# Patient Record
Sex: Female | Born: 1970 | Race: Black or African American | Hispanic: No | Smoking: Never smoker
Health system: Southern US, Community
[De-identification: ages and names within clinical notes are randomized; demographics above are authoritative.]

## PROBLEM LIST (undated history)

## (undated) DIAGNOSIS — O24419 Gestational diabetes mellitus in pregnancy, unspecified control: Secondary | ICD-10-CM

## (undated) DIAGNOSIS — IMO0001 Reserved for inherently not codable concepts without codable children: Secondary | ICD-10-CM

## (undated) DIAGNOSIS — A599 Trichomoniasis, unspecified: Secondary | ICD-10-CM

## (undated) DIAGNOSIS — R896 Abnormal cytological findings in specimens from other organs, systems and tissues: Secondary | ICD-10-CM

## (undated) DIAGNOSIS — B009 Herpesviral infection, unspecified: Secondary | ICD-10-CM

## (undated) DIAGNOSIS — Z8719 Personal history of other diseases of the digestive system: Secondary | ICD-10-CM

## (undated) DIAGNOSIS — Z862 Personal history of diseases of the blood and blood-forming organs and certain disorders involving the immune mechanism: Secondary | ICD-10-CM

## (undated) DIAGNOSIS — B379 Candidiasis, unspecified: Secondary | ICD-10-CM

## (undated) HISTORY — DX: Gestational diabetes mellitus in pregnancy, unspecified control: O24.419

## (undated) HISTORY — DX: Trichomoniasis, unspecified: A59.9

## (undated) HISTORY — DX: Candidiasis, unspecified: B37.9

## (undated) HISTORY — DX: Personal history of other diseases of the digestive system: Z87.19

## (undated) HISTORY — PX: COLPOSCOPY: SHX161

## (undated) HISTORY — DX: Abnormal cytological findings in specimens from other organs, systems and tissues: R89.6

## (undated) HISTORY — DX: Reserved for inherently not codable concepts without codable children: IMO0001

## (undated) HISTORY — DX: Herpesviral infection, unspecified: B00.9

## (undated) HISTORY — DX: Personal history of diseases of the blood and blood-forming organs and certain disorders involving the immune mechanism: Z86.2

## (undated) HISTORY — PX: WISDOM TOOTH EXTRACTION: SHX21

---
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
## 1998-08-30 HISTORY — PX: LEEP: SHX91

## 1999-03-31 ENCOUNTER — Other Ambulatory Visit: Admission: RE | Admit: 1999-03-31 | Discharge: 1999-03-31 | Payer: Self-pay | Admitting: Obstetrics & Gynecology

## 1999-05-28 ENCOUNTER — Other Ambulatory Visit: Admission: RE | Admit: 1999-05-28 | Discharge: 1999-05-28 | Payer: Self-pay | Admitting: Obstetrics & Gynecology

## 1999-05-29 ENCOUNTER — Other Ambulatory Visit: Admission: RE | Admit: 1999-05-29 | Discharge: 1999-05-29 | Payer: Self-pay | Admitting: Obstetrics & Gynecology

## 1999-07-06 ENCOUNTER — Ambulatory Visit (HOSPITAL_COMMUNITY): Admission: RE | Admit: 1999-07-06 | Discharge: 1999-07-06 | Payer: Self-pay | Admitting: Obstetrics & Gynecology

## 1999-10-13 ENCOUNTER — Other Ambulatory Visit: Admission: RE | Admit: 1999-10-13 | Discharge: 1999-10-13 | Payer: Self-pay | Admitting: Obstetrics & Gynecology

## 2000-02-22 ENCOUNTER — Other Ambulatory Visit: Admission: RE | Admit: 2000-02-22 | Discharge: 2000-02-22 | Payer: Self-pay | Admitting: Obstetrics & Gynecology

## 2000-06-23 ENCOUNTER — Other Ambulatory Visit: Admission: RE | Admit: 2000-06-23 | Discharge: 2000-06-23 | Payer: Self-pay | Admitting: *Deleted

## 2001-07-03 ENCOUNTER — Other Ambulatory Visit: Admission: RE | Admit: 2001-07-03 | Discharge: 2001-07-03 | Payer: Self-pay | Admitting: Obstetrics and Gynecology

## 2002-07-16 ENCOUNTER — Other Ambulatory Visit: Admission: RE | Admit: 2002-07-16 | Discharge: 2002-07-16 | Payer: Self-pay | Admitting: Obstetrics and Gynecology

## 2003-08-02 ENCOUNTER — Other Ambulatory Visit: Admission: RE | Admit: 2003-08-02 | Discharge: 2003-08-02 | Payer: Self-pay | Admitting: Obstetrics and Gynecology

## 2003-11-20 ENCOUNTER — Encounter: Admission: RE | Admit: 2003-11-20 | Discharge: 2004-02-18 | Payer: Self-pay | Admitting: Obstetrics and Gynecology

## 2003-12-24 ENCOUNTER — Ambulatory Visit (HOSPITAL_COMMUNITY): Admission: RE | Admit: 2003-12-24 | Discharge: 2003-12-24 | Payer: Self-pay | Admitting: Obstetrics and Gynecology

## 2004-02-26 ENCOUNTER — Ambulatory Visit (HOSPITAL_COMMUNITY): Admission: RE | Admit: 2004-02-26 | Discharge: 2004-02-26 | Payer: Self-pay | Admitting: Obstetrics and Gynecology

## 2004-03-24 ENCOUNTER — Ambulatory Visit (HOSPITAL_COMMUNITY): Admission: RE | Admit: 2004-03-24 | Discharge: 2004-03-24 | Payer: Self-pay | Admitting: Obstetrics and Gynecology

## 2004-04-16 ENCOUNTER — Inpatient Hospital Stay (HOSPITAL_COMMUNITY): Admission: AD | Admit: 2004-04-16 | Discharge: 2004-04-16 | Payer: Self-pay | Admitting: Obstetrics and Gynecology

## 2004-04-18 ENCOUNTER — Inpatient Hospital Stay (HOSPITAL_COMMUNITY): Admission: AD | Admit: 2004-04-18 | Discharge: 2004-04-18 | Payer: Self-pay | Admitting: Obstetrics and Gynecology

## 2004-05-20 ENCOUNTER — Inpatient Hospital Stay (HOSPITAL_COMMUNITY): Admission: AD | Admit: 2004-05-20 | Discharge: 2004-05-23 | Payer: Self-pay | Admitting: Obstetrics and Gynecology

## 2004-09-03 ENCOUNTER — Other Ambulatory Visit: Admission: RE | Admit: 2004-09-03 | Discharge: 2004-09-03 | Payer: Self-pay | Admitting: Obstetrics and Gynecology

## 2010-09-20 ENCOUNTER — Encounter: Payer: Self-pay | Admitting: Obstetrics and Gynecology

## 2012-03-06 ENCOUNTER — Encounter: Payer: Self-pay | Admitting: Obstetrics and Gynecology

## 2012-10-23 ENCOUNTER — Telehealth: Payer: Self-pay | Admitting: Obstetrics and Gynecology

## 2012-10-23 ENCOUNTER — Encounter: Payer: Self-pay | Admitting: Obstetrics and Gynecology

## 2012-10-23 ENCOUNTER — Ambulatory Visit: Payer: BC Managed Care – PPO | Admitting: Obstetrics and Gynecology

## 2012-10-23 VITALS — BP 92/60 | HR 64 | Ht 64.0 in | Wt 176.0 lb

## 2012-10-23 DIAGNOSIS — H02849 Edema of unspecified eye, unspecified eyelid: Secondary | ICD-10-CM

## 2012-10-23 DIAGNOSIS — Z8632 Personal history of gestational diabetes: Secondary | ICD-10-CM

## 2012-10-23 DIAGNOSIS — Z124 Encounter for screening for malignant neoplasm of cervix: Secondary | ICD-10-CM

## 2012-10-23 DIAGNOSIS — Z01419 Encounter for gynecological examination (general) (routine) without abnormal findings: Secondary | ICD-10-CM

## 2012-10-23 DIAGNOSIS — Z83438 Family history of other disorder of lipoprotein metabolism and other lipidemia: Secondary | ICD-10-CM

## 2012-10-23 DIAGNOSIS — Z8349 Family history of other endocrine, nutritional and metabolic diseases: Secondary | ICD-10-CM

## 2012-10-23 DIAGNOSIS — Z833 Family history of diabetes mellitus: Secondary | ICD-10-CM

## 2012-10-23 DIAGNOSIS — Z0000 Encounter for general adult medical examination without abnormal findings: Secondary | ICD-10-CM

## 2012-10-23 LAB — LIPID PANEL
Cholesterol: 169 mg/dL (ref 0–200)
HDL: 49 mg/dL (ref >39)
LDL Cholesterol: 99 mg/dL (ref 0–99)
Total CHOL/HDL Ratio: 3.4 Ratio
VLDL: 21 mg/dL (ref 0–40)

## 2012-10-23 LAB — COMPREHENSIVE METABOLIC PANEL
Albumin: 4.1 g/dL (ref 3.5–5.2)
CO2: 31 mEq/L (ref 19–32)
Calcium: 9.7 mg/dL (ref 8.4–10.5)
Creat: 0.87 mg/dL (ref 0.50–1.10)
Sodium: 137 mEq/L (ref 135–145)
Total Bilirubin: 0.4 mg/dL (ref 0.3–1.2)

## 2012-10-23 LAB — CBC
HCT: 30.1 % — ABNORMAL LOW (ref 36.0–46.0)
Hemoglobin: 9.5 g/dL — ABNORMAL LOW (ref 12.0–15.0)
MCH: 20.8 pg — ABNORMAL LOW (ref 26.0–34.0)
Platelets: 303 10*3/uL (ref 150–400)
RBC: 4.57 MIL/uL (ref 3.87–5.11)
RDW: 18.6 % — ABNORMAL HIGH (ref 11.5–15.5)
WBC: 6.4 10*3/uL (ref 4.0–10.5)

## 2012-10-23 NOTE — Progress Notes (Signed)
Subjective:    Aerielle Stoklosa is a 42 y.o. female, G2P0, who presents for an annual exam. The patient reports no problems but would like her "labs" done-doesn't have a PCP.  Family history of renal disease, diabetes and hyperlipidemia.  Per patient, her eye doctor  advised that she should get her thyroid checked as her eyes appear enlarged.  Last meal 2.5 hours ago.  Has a history of gestational diabetes.  Menstrual cycle:   LMP: Patient's last menstrual period was 10/13/2012.             Review of Systems Pertinent items are noted in HPI. Denies pelvic pain, urinary tract symptoms, vaginitis symptoms, irregular bleeding, menopausal symptoms, change in bowel habits or rectal bleeding   Objective:    BP 92/60  Pulse 64  Ht 5\' 4"  (1.626 m)  Wt 176 lb (79.833 kg)  BMI 30.2 kg/m2  LMP 10/13/2012    Wt Readings from Last 1 Encounters:  10/23/12 176 lb (79.833 kg)   Body mass index is 30.2 kg/(m^2). General Appearance: Alert, no acute distress HEENT: Grossly normal Neck / Thyroid: Supple, no thyromegaly or cervical adenopathy Lungs: Clear to auscultation bilaterally Back: No CVA tenderness Breast Exam: No masses or nodes.No dimpling, nipple retraction or discharge. Cardiovascular: Regular rate and rhythm.  Gastrointestinal: Soft, non-tender, no masses or organomegaly Pelvic Exam: EGBUS-wnl, vagina-normal rugae, cervix- without lesions or tenderness, uterus appears normal size shape and consistency, adnexae-no masses or tenderness Rectovaginal: no masses and normal sphincter tone Lymphatic Exam: Non-palpable nodes in neck, clavicular,  axillary, or inguinal regions  Skin: no rashes or abnormalities Extremities: no clubbing cyanosis or edema  Neurologic: grossly normal Psychiatric: Alert and oriented    Assessment:   Routine GYN Exam H/O Gestational Diabetes Family History Hyperlipidemia TSH Screening per Ophthalmologist Routine Adult Periodic Lab Screening   Plan:  TSH.  Lipid Panel, CBC, CMET -pending  PAP sent  RTO 1 year or prn  Shabrea Weldin,ELMIRAPA-C

## 2012-10-23 NOTE — Progress Notes (Signed)
Regular Periods: yes Mammogram: yes  Monthly Breast Ex.: yes Exercise: yes  Tetanus < 10 years: no Seatbelts: yes  NI. Bladder Functn.: yes Abuse at home: no  Daily BM's: yes Stressful Work: no  Healthy Diet: yes Sigmoid-Colonoscopy: on  Calcium: yes Medical problems this year: none   LAST PAP:2/13  Contraception: condoms  Mammogram:  2013  PCP: no  PMH: no change  FMH: no change  Last Bone Scan: no  Pt is single

## 2012-10-24 LAB — PAP IG W/ RFLX HPV ASCU

## 2012-10-24 MED ORDER — VALACYCLOVIR HCL 500 MG PO TABS: 500.0000 mg | ORAL_TABLET | Freq: Two times a day (BID) | ORAL | Status: AC

## 2012-10-24 NOTE — Telephone Encounter (Signed)
TC to pt. States forgot to ask for RF for VAltrex ar visit. States takes only for outbreaks. Per protocol, rx sent.

## 2012-10-25 LAB — HUMAN PAPILLOMAVIRUS, HIGH RISK: HPV DNA High Risk: NOT DETECTED

## 2012-10-26 ENCOUNTER — Telehealth: Payer: Self-pay

## 2012-10-26 ENCOUNTER — Encounter: Payer: Self-pay | Admitting: Obstetrics and Gynecology

## 2012-10-26 NOTE — Telephone Encounter (Signed)
Message copied by Darien Ramus on Thu Oct 26, 2012  1:56 PM ------      Message from: Winfred Leeds      Created: Thu Oct 26, 2012  1:54 PM                   ----- Message -----         From: Henreitta Leber, PA         Sent: 10/26/2012   1:47 PM           To: Winfred Leeds, CMA            Please advise patient of normal labs except that her iron is low (hemoglobin = 9.5).  Advise to take some over the counter iron supplements twice a day (with stool softeners if she is prone to constipation) for the next 8 weeks.  We can repeat her test then to see if her iron level is normal.  [a letter is being sent to patient about her PAP smear-repeat in 12 months.  Thanks,   EP ------

## 2012-10-26 NOTE — Telephone Encounter (Signed)
Pt aware and voiced understanding.  Zaydin Billey, CMA  

## 2012-10-26 NOTE — Telephone Encounter (Signed)
LVM for return call  Lucciana Head, CMA  

## 2014-07-01 ENCOUNTER — Encounter: Payer: Self-pay | Admitting: Obstetrics and Gynecology

## 2018-03-13 ENCOUNTER — Encounter (HOSPITAL_BASED_OUTPATIENT_CLINIC_OR_DEPARTMENT_OTHER): Payer: Self-pay | Admitting: *Deleted

## 2018-03-13 ENCOUNTER — Emergency Department (HOSPITAL_BASED_OUTPATIENT_CLINIC_OR_DEPARTMENT_OTHER)
Admission: EM | Admit: 2018-03-13 | Discharge: 2018-03-13 | Disposition: A | Discharge status: Home / Self Care | Payer: Managed Care, Other (non HMO) | Attending: Emergency Medicine | Admitting: Emergency Medicine

## 2018-03-13 ENCOUNTER — Emergency Department (HOSPITAL_BASED_OUTPATIENT_CLINIC_OR_DEPARTMENT_OTHER): Payer: Managed Care, Other (non HMO)

## 2018-03-13 ENCOUNTER — Other Ambulatory Visit: Payer: Self-pay

## 2018-03-13 DIAGNOSIS — Y92481 Parking lot as the place of occurrence of the external cause: Secondary | ICD-10-CM | POA: Diagnosis not present

## 2018-03-13 DIAGNOSIS — Y9389 Activity, other specified: Secondary | ICD-10-CM | POA: Insufficient documentation

## 2018-03-13 DIAGNOSIS — S39012A Strain of muscle, fascia and tendon of lower back, initial encounter: Secondary | ICD-10-CM | POA: Diagnosis not present

## 2018-03-13 DIAGNOSIS — Y999 Unspecified external cause status: Secondary | ICD-10-CM | POA: Insufficient documentation

## 2018-03-13 DIAGNOSIS — S3992XA Unspecified injury of lower back, initial encounter: Secondary | ICD-10-CM | POA: Diagnosis present

## 2018-03-13 DIAGNOSIS — Z79899 Other long term (current) drug therapy: Secondary | ICD-10-CM | POA: Diagnosis not present

## 2018-03-13 LAB — PREGNANCY, URINE: Preg Test, Ur: NEGATIVE

## 2018-03-13 MED ORDER — CYCLOBENZAPRINE HCL 10 MG PO TABS: 10.0000 mg | ORAL_TABLET | Freq: Two times a day (BID) | ORAL | 0 refills | Status: DC | PRN

## 2018-03-13 MED ORDER — KETOROLAC TROMETHAMINE 30 MG/ML IJ SOLN
30.0000 mg | Freq: Once | INTRAMUSCULAR | Status: AC
  Administered 2018-03-13: 30 mg via INTRAMUSCULAR
  Filled 2018-03-13: qty 1

## 2018-03-13 MED ORDER — ACETAMINOPHEN 500 MG PO TABS
1000.0000 mg | ORAL_TABLET | Freq: Once | ORAL | Status: AC
Start: 2018-03-13 — End: 2018-03-13
  Administered 2018-03-13: 1000 mg via ORAL
  Filled 2018-03-13: qty 2

## 2018-03-13 MED FILL — CYCLOBENZAPRINE HCL 10 MG T: 10 | 4 days supply | Qty: 8 | Fill #0
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
## 2019-06-28 ENCOUNTER — Encounter (HOSPITAL_BASED_OUTPATIENT_CLINIC_OR_DEPARTMENT_OTHER): Payer: Self-pay | Admitting: Emergency Medicine

## 2019-06-28 ENCOUNTER — Other Ambulatory Visit: Payer: Self-pay

## 2019-06-28 ENCOUNTER — Inpatient Hospital Stay (HOSPITAL_BASED_OUTPATIENT_CLINIC_OR_DEPARTMENT_OTHER)
Admission: EM | Admit: 2019-06-28 | Discharge: 2019-07-08 | DRG: 066 | Disposition: A | Discharge status: Home / Self Care | Payer: Managed Care, Other (non HMO) | Source: Ambulatory Visit | Attending: Neurosurgery | Admitting: Neurosurgery

## 2019-06-28 ENCOUNTER — Emergency Department (HOSPITAL_BASED_OUTPATIENT_CLINIC_OR_DEPARTMENT_OTHER): Payer: Managed Care, Other (non HMO)

## 2019-06-28 DIAGNOSIS — E611 Iron deficiency: Secondary | ICD-10-CM | POA: Diagnosis present

## 2019-06-28 DIAGNOSIS — B009 Herpesviral infection, unspecified: Secondary | ICD-10-CM | POA: Diagnosis present

## 2019-06-28 DIAGNOSIS — Z833 Family history of diabetes mellitus: Secondary | ICD-10-CM

## 2019-06-28 DIAGNOSIS — I609 Nontraumatic subarachnoid hemorrhage, unspecified: Principal | ICD-10-CM

## 2019-06-28 DIAGNOSIS — D573 Sickle-cell trait: Secondary | ICD-10-CM | POA: Diagnosis present

## 2019-06-28 DIAGNOSIS — Z888 Allergy status to other drugs, medicaments and biological substances status: Secondary | ICD-10-CM | POA: Diagnosis not present

## 2019-06-28 DIAGNOSIS — Z20828 Contact with and (suspected) exposure to other viral communicable diseases: Secondary | ICD-10-CM | POA: Diagnosis present

## 2019-06-28 DIAGNOSIS — Z8632 Personal history of gestational diabetes: Secondary | ICD-10-CM

## 2019-06-28 DIAGNOSIS — Z79899 Other long term (current) drug therapy: Secondary | ICD-10-CM

## 2019-06-28 DIAGNOSIS — Z91018 Allergy to other foods: Secondary | ICD-10-CM

## 2019-06-28 DIAGNOSIS — E1165 Type 2 diabetes mellitus with hyperglycemia: Secondary | ICD-10-CM

## 2019-06-28 LAB — CBC WITH DIFFERENTIAL/PLATELET
Abs Immature Granulocytes: 0.09 10*3/uL — ABNORMAL HIGH (ref 0.00–0.07)
Basophils Absolute: 0 10*3/uL (ref 0.0–0.1)
Basophils Relative: 0 %
Eosinophils Absolute: 0 10*3/uL (ref 0.0–0.5)
Eosinophils Relative: 0 %
HCT: 37.6 % (ref 36.0–46.0)
Hemoglobin: 12.2 g/dL (ref 12.0–15.0)
Immature Granulocytes: 1 %
Lymphocytes Relative: 2 %
Lymphs Abs: 0.4 10*3/uL — ABNORMAL LOW (ref 0.7–4.0)
MCH: 25.3 pg — ABNORMAL LOW (ref 26.0–34.0)
MCHC: 32.4 g/dL (ref 30.0–36.0)
MCV: 77.8 fL — ABNORMAL LOW (ref 80.0–100.0)
Monocytes Absolute: 0.4 10*3/uL (ref 0.1–1.0)
Monocytes Relative: 2 %
Neutro Abs: 16.4 10*3/uL — ABNORMAL HIGH (ref 1.7–7.7)
Neutrophils Relative %: 95 %
Platelets: 209 10*3/uL (ref 150–400)
RBC: 4.83 MIL/uL (ref 3.87–5.11)
RDW: 14.2 % (ref 11.5–15.5)
WBC: 17.3 10*3/uL — ABNORMAL HIGH (ref 4.0–10.5)
nRBC: 0 % (ref 0.0–0.2)

## 2019-06-28 LAB — BASIC METABOLIC PANEL
Anion gap: 12 (ref 5–15)
BUN: 9 mg/dL (ref 6–20)
CO2: 23 mmol/L (ref 22–32)
Calcium: 9.2 mg/dL (ref 8.9–10.3)
Chloride: 101 mmol/L (ref 98–111)
Creatinine, Ser: 0.82 mg/dL (ref 0.44–1.00)
GFR calc Af Amer: >60 mL/min (ref >60)
GFR calc non Af Amer: >60 mL/min (ref >60)
Glucose, Bld: 302 mg/dL — ABNORMAL HIGH (ref 70–99)
Potassium: 4.3 mmol/L (ref 3.5–5.1)
Sodium: 136 mmol/L (ref 135–145)

## 2019-06-28 LAB — PREGNANCY, URINE: Preg Test, Ur: NEGATIVE

## 2019-06-28 MED ORDER — SODIUM CHLORIDE 0.9 % IV BOLUS
500.0000 mL | Freq: Once | INTRAVENOUS | Status: AC
  Administered 2019-06-28: 500 mL via INTRAVENOUS

## 2019-06-28 MED ORDER — STROKE: EARLY STAGES OF RECOVERY BOOK
Freq: Once | Status: AC
  Administered 2019-06-29: 03:00:00
  Filled 2019-06-28 (×2): qty 1

## 2019-06-28 MED ORDER — BUTALBITAL-APAP-CAFFEINE 50-325-40 MG PO TABS
1.0000 | ORAL_TABLET | ORAL | Status: DC | PRN
  Administered 2019-06-29: 2 via ORAL
  Filled 2019-06-28 (×2): qty 2

## 2019-06-28 MED ORDER — NIMODIPINE 6 MG/ML PO SOLN
60.0000 mg | ORAL | Status: DC
  Administered 2019-06-29 – 2019-07-02 (×2): 60 mg
  Filled 2019-06-28 (×5): qty 10

## 2019-06-28 MED ORDER — LEVETIRACETAM IN NACL 500 MG/100ML IV SOLN
500.0000 mg | Freq: Two times a day (BID) | INTRAVENOUS | Status: DC
  Administered 2019-06-29 – 2019-07-01 (×5): 500 mg via INTRAVENOUS
  Filled 2019-06-28 (×6): qty 100

## 2019-06-28 MED ORDER — OXYCODONE HCL 5 MG PO TABS: 5.0000 mg | ORAL_TABLET | ORAL | Status: DC | PRN

## 2019-06-28 MED ORDER — ONDANSETRON 4 MG PO TBDP: 4.0000 mg | ORAL_TABLET | Freq: Four times a day (QID) | ORAL | Status: DC | PRN

## 2019-06-28 MED ORDER — CALCIUM CARBONATE 1250 (500 CA) MG PO TABS
0.5000 | ORAL_TABLET | Freq: Two times a day (BID) | ORAL | Status: DC
  Administered 2019-06-29 – 2019-07-07 (×12): 250 mg via ORAL
  Administered 2019-07-07: 17:00:00 via ORAL
  Administered 2019-07-08: 250 mg via ORAL
  Filled 2019-06-28 (×21): qty 0.5

## 2019-06-28 MED ORDER — METOCLOPRAMIDE HCL 5 MG/ML IJ SOLN
10.0000 mg | Freq: Once | INTRAMUSCULAR | Status: AC
  Administered 2019-06-28: 10 mg via INTRAVENOUS
  Filled 2019-06-28: qty 2

## 2019-06-28 MED ORDER — METHOCARBAMOL 1000 MG/10ML IJ SOLN
500.0000 mg | Freq: Once | INTRAMUSCULAR | Status: AC
Start: 2019-06-28 — End: 2019-06-28
  Administered 2019-06-28: 21:00:00 500 mg via INTRAMUSCULAR
  Filled 2019-06-28: qty 10

## 2019-06-28 MED ORDER — PRENATAL MULTIVITAMIN CH
1.0000 | ORAL_TABLET | Freq: Every day | ORAL | Status: DC
  Administered 2019-06-30 – 2019-07-08 (×8): 1 via ORAL
  Filled 2019-06-28 (×10): qty 1

## 2019-06-28 MED ORDER — DOCUSATE SODIUM 100 MG PO CAPS
100.0000 mg | ORAL_CAPSULE | Freq: Two times a day (BID) | ORAL | Status: DC
  Administered 2019-06-29 – 2019-07-07 (×12): 100 mg via ORAL
  Filled 2019-06-28 (×17): qty 1

## 2019-06-28 MED ORDER — KETOROLAC TROMETHAMINE 30 MG/ML IJ SOLN
30.0000 mg | Freq: Once | INTRAMUSCULAR | Status: AC
  Administered 2019-06-28: 21:00:00 30 mg via INTRAVENOUS
  Filled 2019-06-28: qty 1

## 2019-06-28 MED ORDER — CYCLOBENZAPRINE HCL 10 MG PO TABS
10.0000 mg | ORAL_TABLET | Freq: Two times a day (BID) | ORAL | Status: DC | PRN
  Administered 2019-07-04 – 2019-07-08 (×3): 10 mg via ORAL
  Filled 2019-06-28 (×4): qty 1

## 2019-06-28 MED ORDER — ACETAMINOPHEN 160 MG/5ML PO SOLN: 650.0000 mg | ORAL | Status: DC | PRN

## 2019-06-28 MED ORDER — ACETAMINOPHEN 500 MG PO TABS
1000.0000 mg | ORAL_TABLET | Freq: Once | ORAL | Status: AC
  Administered 2019-06-28: 1000 mg via ORAL
  Filled 2019-06-28: qty 2

## 2019-06-28 MED ORDER — NIMODIPINE 30 MG PO CAPS
60.0000 mg | ORAL_CAPSULE | ORAL | Status: DC
  Administered 2019-06-29 – 2019-07-08 (×53): 60 mg via ORAL
  Filled 2019-06-28 (×53): qty 2

## 2019-06-28 MED ORDER — CLEVIDIPINE BUTYRATE 0.5 MG/ML IV EMUL
0.0000 mg/h | INTRAVENOUS | Status: DC
  Administered 2019-06-29: 7 mg/h via INTRAVENOUS
  Administered 2019-06-29: 6 mg/h via INTRAVENOUS
  Administered 2019-06-29: 1 mg/h via INTRAVENOUS
  Administered 2019-06-29: 17 mg/h via INTRAVENOUS
  Administered 2019-06-29: 18 mg/h via INTRAVENOUS
  Administered 2019-06-29: 16 mg/h via INTRAVENOUS
  Filled 2019-06-28: qty 100
  Filled 2019-06-28 (×7): qty 50

## 2019-06-28 MED ORDER — IOHEXOL 350 MG/ML SOLN
100.0000 mL | Freq: Once | INTRAVENOUS | Status: AC | PRN
  Administered 2019-06-28: 100 mL via INTRAVENOUS

## 2019-06-28 MED ORDER — ACETAMINOPHEN-CODEINE #3 300-30 MG PO TABS
1.0000 | ORAL_TABLET | ORAL | Status: DC | PRN
  Administered 2019-06-30: 2 via ORAL
  Administered 2019-06-30: 1 via ORAL
  Administered 2019-07-01 – 2019-07-02 (×4): 2 via ORAL
  Administered 2019-07-03: 1 via ORAL
  Administered 2019-07-03 (×2): 2 via ORAL
  Administered 2019-07-03: 1 via ORAL
  Administered 2019-07-04 – 2019-07-06 (×5): 2 via ORAL
  Filled 2019-06-28 (×2): qty 2
  Filled 2019-06-28 (×2): qty 1
  Filled 2019-06-28 (×5): qty 2
  Filled 2019-06-28: qty 1
  Filled 2019-06-28 (×5): qty 2

## 2019-06-28 MED ORDER — ONDANSETRON HCL 4 MG/2ML IJ SOLN: 4.0000 mg | Freq: Four times a day (QID) | INTRAMUSCULAR | Status: DC | PRN

## 2019-06-28 MED ORDER — SODIUM CHLORIDE 0.9 % IV SOLN
INTRAVENOUS | Status: DC
  Administered 2019-06-29: 03:00:00 via INTRAVENOUS

## 2019-06-28 MED ORDER — ACETAMINOPHEN 325 MG PO TABS
650.0000 mg | ORAL_TABLET | ORAL | Status: DC | PRN
  Administered 2019-07-07 – 2019-07-08 (×2): 650 mg via ORAL
  Filled 2019-06-28 (×3): qty 2

## 2019-06-28 MED ORDER — ACETAMINOPHEN 650 MG RE SUPP: 650.0000 mg | RECTAL | Status: DC | PRN
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
## 2019-06-28 NOTE — ED Provider Notes (Signed)
[facility] EMERGENCY DEPARTMENT Provider Note   CSN: [id number] Arrival date & time: 06/28/19  1650     History   Chief Complaint Chief Complaint  Patient presents with  . Headache    HPI Carly Hicks is a 48 y.o. female here for evaluation of headache. Sudden onset while she was on a "stressful" call with work at 3 pm.  Described as severe 10/10, quick in onset over 5-30 seconds.  Pain began at base of scalp and radiating to back of head, down neck and in between shoulder blades.  She had associated breaking out in sweat, nausea, pounding in her ears and feeling like the sounds were fading out, neck pain, photophobia.  She arrived to ER and vomited once.  She took 1 advil at 3 pm without relief.  Currently pain 9.5/10.  Her headache and neck pain are worse with bright lights, head and neck movements.  Gets rare occasional headaches but never this bad. No history of migraines. No recent head or neck trauma, falls. No personal or family history of aneurysms, AVMs.  No blood thinners. No fever, chills, diplopia, difficulty swallowing, speech difficulty, tinnitus, one sided weakness or numbness. Went to UC at 4 pm but she couldn't get out or walk out of her car due to pain. She was told because of severity of pain and high BP she should come to ER. Reports BP is usually low, no h/o HTN. Was told at [facility] her SBP was 190s.   HPI  Past Medical History:  Diagnosis Date  . ASCUS (atypical squamous cells of undetermined significance) on Pap smear   . Gestational diabetes   . H/O sickle cell trait   . History of anal fissures   . HSV-2 infection   . Trichimoniasis 1991  . Yeast infection    h/o    Patient Active Problem List   Diagnosis Date Noted  . Subarachnoid hemorrhage ([place]) 06/28/2019    Past Surgical History:  Procedure Laterality Date  . COLPOSCOPY    . LEEP  2000  . WISDOM TOOTH EXTRACTION       OB History    Gravida  2   Para      Term      Preterm    AB      Living  1     SAB      TAB      Ectopic      Multiple      Live Births               Home Medications    Prior to Admission medications   Medication Sig Start Date End Date Taking? Authorizing Provider  calcium carbonate 200 MG capsule Take 250 mg by mouth 2 (two) times daily with a meal. Pt is taking chewable calcium    [provider]  cyclobenzaprine (FLEXERIL) 10 MG tablet Take 1 tablet (10 mg total) by mouth 2 (two) times daily as needed for muscle spasms. 03/13/18   Little, Wenda Overland, MD  IRON PO Take by mouth.    [provider]  Prenatal MV-Min-Fe Fum-FA-DHA (PRENATAL 1 PO) Take by mouth.    [provider]  valACYclovir (VALTREX) 500 MG tablet Take 1 tablet (500 mg total) by mouth 2 (two) times daily. TAKE ONE PO TWO TIMES A DAY X 3 DAYS AS NEEDED FOR OUTBREAK. 10/24/12   Earnstine Regal, PA-C    Family History Family History  Problem Relation Age of  Onset  . Diabetes Mother   . Hyperlipidemia Mother   . Thyroid disease Mother        mom had parathyroid removed  . Diabetes Maternal Aunt   . Kidney disease Maternal Aunt   . Diabetes Maternal Uncle   . Diabetes Maternal Grandmother   . Kidney disease Maternal Grandmother   . Anemia Sister   . Gestational diabetes Sister   . Colon cancer Paternal Aunt   . Chronic Renal Failure Maternal Aunt   . Cancer Maternal Aunt        breast    Social History Social History   Tobacco Use  . Smoking status: Never Smoker  . Smokeless tobacco: Never Used  Substance Use Topics  . Alcohol use: No  . Drug use: No     Allergies   Patient has no known allergies.   Review of Systems Review of Systems  Gastrointestinal: Positive for nausea and vomiting.  Musculoskeletal: Positive for neck pain and neck stiffness.  Neurological: Positive for headaches.  All other systems reviewed and are negative.    Physical Exam Updated Vital Signs BP (!) 147/87   Pulse 92   Temp  98.3 F (36.8 C) (Oral)   Resp 14   Ht 5\' 4"  (1.626 m)   Wt 74.8 kg   LMP 06/24/2019   SpO2 99%   BMI 28.32 kg/m   Physical Exam Vitals signs and nursing note reviewed.  Constitutional:      General: She is not in acute distress.    Appearance: She is well-developed.     Comments: NAD, non toxic but appears uncomfortable   HENT:     Head: Normocephalic and atraumatic.     Comments: No temporal tenderness    Right Ear: External ear normal.     Left Ear: External ear normal.     Nose: Nose normal.  Eyes:     General: No scleral icterus.    Conjunctiva/sclera: Conjunctivae normal.  Neck:     Musculoskeletal: Normal range of motion. Neck rigidity present.     Comments: Bilateral trapezius, paraspinal neck muscle tenderness. Decreased neck bend/rotation/flexion secondary to pain. +meningismus. No carotid bruits  Cardiovascular:     Rate and Rhythm: Normal rate and regular rhythm.     Heart sounds: Normal heart sounds.  Pulmonary:     Effort: Pulmonary effort is normal.     Breath sounds: Normal breath sounds.  Musculoskeletal: Normal range of motion.        General: No deformity.  Skin:    General: Skin is warm and dry.     Capillary Refill: Capillary refill takes less than 2 seconds.  Neurological:     Mental Status: She is alert and oriented to person, place, and time.     Comments:   Mental Status: Patient is awake, alert, oriented to person, place, year, and situation. Patient is able to give a clear and coherent history.  Speech is fluent and clear without dysarthria or aphasia.  No signs of neglect.  Cranial Nerves: I not tested II visual fields full bilaterally. PERRL.  Unable to visualize posterior eye. III, IV, VI EOMs intact without ptosis or diplopia  V sensation to light touch intact in all 3 divisions of trigeminal nerve bilaterally  VII facial movements symmetric bilaterally VIII hearing intact to voice/conversation  IX, X no uvula deviation, symmetric  rise of soft palate/uvula XI 5/5 SCM and trapezius strength bilaterally  XII tongue protrusion midline, symmetric L/R movements Motor:  Strength 5/5 in upper/lower extremities .   Sensation to light touch intact in face, upper/lower extremities. No pronator drift. No leg drop.  Cerebellar: No ataxia with finger to nose.  Steady gait. Normal Romberg.   Psychiatric:        Behavior: Behavior normal.        Thought Content: Thought content normal.        Judgment: Judgment normal.      ED Treatments / Results  Labs (all labs ordered are listed, but only abnormal results are displayed) Labs Reviewed  BASIC METABOLIC PANEL - Abnormal; Notable for the following components:      Result Value   Glucose, Bld 302 (*)    All other components within normal limits  CBC WITH DIFFERENTIAL/PLATELET - Abnormal; Notable for the following components:   WBC 17.3 (*)    MCV 77.8 (*)    MCH 25.3 (*)    Neutro Abs 16.4 (*)    Lymphs Abs 0.4 (*)    Abs Immature Granulocytes 0.09 (*)    All other components within normal limits  CBC - Abnormal; Notable for the following components:   WBC 15.5 (*)    MCV 77.8 (*)    MCH 25.4 (*)    All other components within normal limits  APTT - Abnormal; Notable for the following components:   aPTT 22 (*)    All other components within normal limits  SARS CORONAVIRUS 2 BY RT PCR (HOSPITAL ORDER, PERFORMED IN [facility])  SURGICAL PCR SCREEN  PREGNANCY, URINE  PROTIME-INR  HIV ANTIBODY (ROUTINE TESTING W REFLEX)  RAPID URINE DRUG SCREEN, HOSP PERFORMED    EKG None  Radiology Ct Angio Head W Or Wo Contrast  Result Date: 06/28/2019 CLINICAL DATA:  Thunderclap headache EXAM: CT ANGIOGRAPHY HEAD AND NECK TECHNIQUE: Multidetector CT imaging of the head and neck was performed using the standard protocol during bolus administration of intravenous contrast. Multiplanar CT image reconstructions and MIPs were obtained to evaluate the vascular  anatomy. Carotid stenosis measurements (when applicable) are obtained utilizing NASCET criteria, using the distal internal carotid diameter as the denominator. CONTRAST:  100mL OMNIPAQUE IOHEXOL 350 MG/ML SOLN COMPARISON:  None. FINDINGS: CT HEAD FINDINGS Brain: There is a large amount of subarachnoid hemorrhage within the basal cisterns and sylvian fissures. The lateral ventricles are dilated. No evidence of transependymal interstitial edema. There is no midline shift. Skull: The visualized skull base, calvarium and extracranial soft tissues are normal. Sinuses/Orbits: No fluid levels or advanced mucosal thickening of the visualized paranasal sinuses. No mastoid or middle ear effusion. The orbits are normal. CTA NECK FINDINGS SKELETON: There is no bony spinal canal stenosis. No lytic or blastic lesion. OTHER NECK: Normal pharynx, larynx and major salivary glands. No cervical lymphadenopathy. Unremarkable thyroid gland. UPPER CHEST: No pneumothorax or pleural effusion. No nodules or masses. AORTIC ARCH: There is no calcific atherosclerosis of the aortic arch. There is no aneurysm, dissection or hemodynamically significant stenosis of the visualized portion of the aorta. Conventional 3 vessel aortic branching pattern. The visualized proximal subclavian arteries are widely patent. RIGHT CAROTID SYSTEM: Normal without aneurysm, dissection or stenosis. LEFT CAROTID SYSTEM: Normal without aneurysm, dissection or stenosis. VERTEBRAL ARTERIES: Left dominant configuration. Both origins are clearly patent. There is no dissection, occlusion or flow-limiting stenosis to the skull base (V1-V3 segments). CTA HEAD FINDINGS POSTERIOR CIRCULATION: --Vertebral arteries: Normal V4 segments. --Posterior inferior cerebellar arteries (PICA): Patent origins from the vertebral arteries. --Anterior inferior cerebellar arteries (AICA): Patent  origins from the basilar artery. --Basilar artery: Normal. --Superior cerebellar arteries: Normal.  --Posterior cerebral arteries: Normal. There are bilateral posterior communicating arteries (p-comm) that partially supply the PCAs. ANTERIOR CIRCULATION: --Intracranial internal carotid arteries: Normal. --Anterior cerebral arteries (ACA): Normal. Both A1 segments are present. Patent anterior communicating artery (a-comm). --Middle cerebral arteries (MCA): Normal. There is no aneurysm visualized. There appears to a concentration of subarachnoid blood adjacent to the cavernous segment of the right ICA, although this might also represent asymmetric cavernous sinus enhancement. VENOUS SINUSES: As permitted by contrast timing, patent. ANATOMIC VARIANTS: None Review of the MIP images confirms the above findings. IMPRESSION: 1. Large amount of subarachnoid hemorrhage within the basal cisterns and Sylvian fissures. Dilated ventricles without evidence of transependymal interstitial edema. 2. No aneurysm visualized. Concentration of subarachnoid blood along the medial aspect of the cavernous segment of the right ICA vs asymmetric cavernous sinus enhancement. 3. No arterial occlusion. Critical Value/emergent results were called by telephone at the time of interpretation on 06/28/2019 at 11:28 pm to providerCLAUDIA Kodah Maret , who verbally acknowledged these results. Electronically Signed   By: Deatra Robinson M.D.   On: 06/28/2019 23:39   Ct Angio Neck W And/or Wo Contrast  Result Date: 06/28/2019 CLINICAL DATA:  Thunderclap headache EXAM: CT ANGIOGRAPHY HEAD AND NECK TECHNIQUE: Multidetector CT imaging of the head and neck was performed using the standard protocol during bolus administration of intravenous contrast. Multiplanar CT image reconstructions and MIPs were obtained to evaluate the vascular anatomy. Carotid stenosis measurements (when applicable) are obtained utilizing NASCET criteria, using the distal internal carotid diameter as the denominator. CONTRAST:  OMNIPAQUE IOHEXOL 350 MG/ML SOLN COMPARISON:   None. FINDINGS: CT HEAD FINDINGS Brain: There is a large amount of subarachnoid hemorrhage within the basal cisterns and sylvian fissures. The lateral ventricles are dilated. No evidence of transependymal interstitial edema. There is no midline shift. Skull: The visualized skull base, calvarium and extracranial soft tissues are normal. Sinuses/Orbits: No fluid levels or advanced mucosal thickening of the visualized paranasal sinuses. No mastoid or middle ear effusion. The orbits are normal. CTA NECK FINDINGS SKELETON: There is no bony spinal canal stenosis. No lytic or blastic lesion. OTHER NECK: Normal pharynx, larynx and major salivary glands. No cervical lymphadenopathy. Unremarkable thyroid gland. UPPER CHEST: No pneumothorax or pleural effusion. No nodules or masses. AORTIC ARCH: There is no calcific atherosclerosis of the aortic arch. There is no aneurysm, dissection or hemodynamically significant stenosis of the visualized portion of the aorta. Conventional 3 vessel aortic branching pattern. The visualized proximal subclavian arteries are widely patent. RIGHT CAROTID SYSTEM: Normal without aneurysm, dissection or stenosis. LEFT CAROTID SYSTEM: Normal without aneurysm, dissection or stenosis. VERTEBRAL ARTERIES: Left dominant configuration. Both origins are clearly patent. There is no dissection, occlusion or flow-limiting stenosis to the skull base (V1-V3 segments). CTA HEAD FINDINGS POSTERIOR CIRCULATION: --Vertebral arteries: Normal V4 segments. --Posterior inferior cerebellar arteries (PICA): Patent origins from the vertebral arteries. --Anterior inferior cerebellar arteries (AICA): Patent origins from the basilar artery. --Basilar artery: Normal. --Superior cerebellar arteries: Normal. --Posterior cerebral arteries: Normal. There are bilateral posterior communicating arteries (p-comm) that partially supply the PCAs. ANTERIOR CIRCULATION: --Intracranial internal carotid arteries: Normal. --Anterior  cerebral arteries (ACA): Normal. Both A1 segments are present. Patent anterior communicating artery (a-comm). --Middle cerebral arteries (MCA): Normal. There is no aneurysm visualized. There appears to a concentration of subarachnoid blood adjacent to the cavernous segment of the right ICA, although this might also represent asymmetric cavernous sinus enhancement. VENOUS SINUSES: As  permitted by contrast timing, patent. ANATOMIC VARIANTS: None Review of the MIP images confirms the above findings. IMPRESSION: 1. Large amount of subarachnoid hemorrhage within the basal cisterns and Sylvian fissures. Dilated ventricles without evidence of transependymal interstitial edema. 2. No aneurysm visualized. Concentration of subarachnoid blood along the medial aspect of the cavernous segment of the right ICA vs asymmetric cavernous sinus enhancement. 3. No arterial occlusion. Critical Value/emergent results were called by telephone at the time of interpretation on 06/28/2019 at 11:28 pm to providerCLAUDIA Tyyne Cliett , who verbally acknowledged these results. Electronically Signed   By: Deatra Robinson M.D.   On: 06/28/2019 23:39    Procedures .Critical Care Performed by: Liberty Handy, PA-C Authorized by: Liberty Handy, PA-C   Critical care provider statement:    Critical care time (minutes):  45   Critical care was necessary to treat or prevent imminent or life-threatening deterioration of the following conditions:  CNS failure or compromise   Critical care was time spent personally by me on the following activities:  Discussions with consultants, evaluation of patient's response to treatment, examination of patient, ordering and performing treatments and interventions, ordering and review of laboratory studies, ordering and review of radiographic studies, pulse oximetry, re-evaluation of patient's condition, obtaining history from patient or surrogate, review of old charts and development of treatment plan  with patient or surrogate   I assumed direction of critical care for this patient from another provider in my specialty: no     (including critical care time)  Medications Ordered in ED Medications  butalbital-acetaminophen-caffeine (FIORICET) 50-325-40 MG per tablet 1-2 tablet (has no administration in time range)  oxyCODONE (Oxy IR/ROXICODONE) immediate release tablet 5 mg (has no administration in time range)  calcium carbonate (OS-CAL - dosed in mg of elemental calcium) tablet 250 mg of elemental calcium (has no administration in time range)  prenatal multivitamin tablet 1 tablet (has no administration in time range)  cyclobenzaprine (FLEXERIL) tablet 10 mg (has no administration in time range)   stroke: mapping our early stages of recovery book (has no administration in time range)  0.9 %  sodium chloride infusion (has no administration in time range)  acetaminophen (TYLENOL) tablet 650 mg (has no administration in time range)    Or  acetaminophen (TYLENOL) 160 MG/5ML solution 650 mg (has no administration in time range)    Or  acetaminophen (TYLENOL) suppository 650 mg (has no administration in time range)  docusate sodium (COLACE) capsule 100 mg (has no administration in time range)  ondansetron (ZOFRAN-ODT) disintegrating tablet 4 mg (has no administration in time range)    Or  ondansetron (ZOFRAN) injection 4 mg (has no administration in time range)  niMODipine (NIMOTOP) capsule 60 mg (has no administration in time range)    Or  niMODipine (NYMALIZE) 6 MG/ML oral solution 60 mg (has no administration in time range)  acetaminophen-codeine (TYLENOL #3) 300-30 MG per tablet 1-2 tablet (has no administration in time range)  clevidipine (CLEVIPREX) infusion 0.5 mg/mL (has no administration in time range)  levETIRAcetam (KEPPRA) IVPB 500 mg/100 mL premix (0 mg Intravenous Stopped 06/29/19 0128)  Chlorhexidine Gluconate Cloth 2 % PADS 6 each (has no administration in time range)   ketorolac (TORADOL) 30 MG/ML injection 30 mg (30 mg Intravenous Given 06/28/19 2122)  metoCLOPramide (REGLAN) injection 10 mg (10 mg Intravenous Given 06/28/19 2122)  acetaminophen (TYLENOL) tablet 1,000 mg (1,000 mg Oral Given 06/28/19 2122)  sodium chloride 0.9 % bolus 500 mL (0 mLs Intravenous Stopped  06/28/19 2221)  methocarbamol (ROBAXIN) injection 500 mg (500 mg Intramuscular Given 06/28/19 2122)  iohexol (OMNIPAQUE) 350 MG/ML injection 100 mL (100 mLs Intravenous Contrast Given 06/28/19 2301)     Initial Impression / Assessment and Plan / ED Course  I have reviewed the triage vital signs and the nursing notes.  Pertinent labs & imaging results that were available during my care of the patient were reviewed by me and considered in my medical decision making (see chart for details).  Ddx includes tension type headache vs migraine.  She describes several red flag features include sudden onset, severe, nausea, vomiting, sweats, neck pain and stiffness. 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  I am concerned about spontaneous atraumatic intracranial bleed, vascular injury in neck.  No fever and meningitis less likely.  No temporal tenderness. Atraumatic.   Recommended CT scan and migraine cocktail.  Patient decline CT and wanted to try medicine first. Thinks it is a "stress headache".   2145: Patient given migraine medicines, re-evaluated and reports no improvement in pain. Now agreeable with proceeding with imaging. CTA head and neck ordered.   CTA reviewed and interpreted by me as well as radiologist, +large bleed with likely aneurysm as culprit.    Pt updated, re-evaluated.  Reports moderate improvement in head and neck pain.  Repeat full neuro exam performed and no decline prior to transfer.   Spoke to NSGY PA [facility] who has accepted patient, see recommendations above. Anticipate procedure tomorrow AM, none tonight.  Admitting order per NSGY including cleviprex.  Admit to ICU.  Discussed with EDP.    Final Clinical Impressions(s) / ED Diagnoses   Final diagnoses:  SAH (subarachnoid hemorrhage) [facility])    ED Discharge Orders    None       Liberty Handy, PA-C 06/29/19 4540    Tegeler, Canary Brim, MD 06/29/19 1045

## 2019-06-28 NOTE — ED Triage Notes (Addendum)
Headache to the back of her head with neck pain that started 2 hours ago during a stressful phone call. Pt took "1 motrin" PTA

## 2019-06-29 ENCOUNTER — Encounter (HOSPITAL_COMMUNITY): Payer: Self-pay | Admitting: Critical Care Medicine

## 2019-06-29 ENCOUNTER — Inpatient Hospital Stay (HOSPITAL_COMMUNITY): Payer: Managed Care, Other (non HMO) | Admitting: Critical Care Medicine

## 2019-06-29 ENCOUNTER — Encounter (HOSPITAL_COMMUNITY)
Admission: EM | Disposition: A | Discharge status: Home / Self Care | Payer: Self-pay | Source: Ambulatory Visit | Attending: Neurosurgery

## 2019-06-29 ENCOUNTER — Inpatient Hospital Stay (HOSPITAL_COMMUNITY): Payer: Managed Care, Other (non HMO)

## 2019-06-29 DIAGNOSIS — I609 Nontraumatic subarachnoid hemorrhage, unspecified: Secondary | ICD-10-CM

## 2019-06-29 HISTORY — PX: IR ANGIO VERTEBRAL SEL VERTEBRAL BILAT MOD SED: IMG5369

## 2019-06-29 HISTORY — PX: RADIOLOGY WITH ANESTHESIA: SHX6223

## 2019-06-29 HISTORY — PX: IR ANGIO INTRA EXTRACRAN SEL INTERNAL CAROTID BILAT MOD SED: IMG5363

## 2019-06-29 LAB — RAPID URINE DRUG SCREEN, HOSP PERFORMED
Amphetamines: NOT DETECTED
Barbiturates: NOT DETECTED
Benzodiazepines: NOT DETECTED
Cocaine: NOT DETECTED
Opiates: NOT DETECTED
Tetrahydrocannabinol: NOT DETECTED

## 2019-06-29 LAB — CBC
HCT: 37.1 % (ref 36.0–46.0)
Hemoglobin: 12.1 g/dL (ref 12.0–15.0)
MCH: 25.4 pg — ABNORMAL LOW (ref 26.0–34.0)
MCHC: 32.6 g/dL (ref 30.0–36.0)
MCV: 77.8 fL — ABNORMAL LOW (ref 80.0–100.0)
Platelets: 214 10*3/uL (ref 150–400)
RBC: 4.77 MIL/uL (ref 3.87–5.11)
RDW: 14.2 % (ref 11.5–15.5)
WBC: 15.5 10*3/uL — ABNORMAL HIGH (ref 4.0–10.5)
nRBC: 0 % (ref 0.0–0.2)

## 2019-06-29 LAB — APTT: aPTT: 22 seconds — ABNORMAL LOW (ref 24–36)

## 2019-06-29 LAB — GLUCOSE, CAPILLARY
Glucose-Capillary: 199 mg/dL — ABNORMAL HIGH (ref 70–99)
Glucose-Capillary: 186 mg/dL — ABNORMAL HIGH (ref 70–99)
Glucose-Capillary: 167 mg/dL — ABNORMAL HIGH (ref 70–99)
Glucose-Capillary: 275 mg/dL — ABNORMAL HIGH (ref 70–99)

## 2019-06-29 LAB — SURGICAL PCR SCREEN
MRSA, PCR: NEGATIVE
Staphylococcus aureus: NEGATIVE

## 2019-06-29 LAB — PREGNANCY, URINE: Preg Test, Ur: NEGATIVE

## 2019-06-29 LAB — HEMOGLOBIN A1C
Hgb A1c MFr Bld: 9.9 % — ABNORMAL HIGH (ref 4.8–5.6)
Mean Plasma Glucose: 237.43 mg/dL

## 2019-06-29 LAB — PROTIME-INR
INR: 1 (ref 0.8–1.2)
Prothrombin Time: 13.4 seconds (ref 11.4–15.2)

## 2019-06-29 LAB — SARS CORONAVIRUS 2 BY RT PCR (HOSPITAL ORDER, PERFORMED IN ~~LOC~~ HOSPITAL LAB): SARS Coronavirus 2: NEGATIVE

## 2019-06-29 LAB — HIV ANTIBODY (ROUTINE TESTING W REFLEX): HIV Screen 4th Generation wRfx: NONREACTIVE

## 2019-06-29 SURGERY — IR WITH ANESTHESIA
Anesthesia: Monitor Anesthesia Care

## 2019-06-29 MED ORDER — CHLORHEXIDINE GLUCONATE CLOTH 2 % EX PADS
6.0000 | MEDICATED_PAD | Freq: Every day | CUTANEOUS | Status: DC
  Administered 2019-06-29 – 2019-07-07 (×8): 6 via TOPICAL

## 2019-06-29 MED ORDER — INSULIN ASPART 100 UNIT/ML ~~LOC~~ SOLN
0.0000 [IU] | Freq: Three times a day (TID) | SUBCUTANEOUS | Status: DC
  Administered 2019-06-29 – 2019-06-30 (×3): 3 [IU] via SUBCUTANEOUS
  Administered 2019-06-30: 15 [IU] via SUBCUTANEOUS

## 2019-06-29 MED ORDER — IOHEXOL 300 MG/ML  SOLN
150.0000 mL | Freq: Once | INTRAMUSCULAR | Status: AC | PRN
  Administered 2019-06-29: 45 mL via INTRA_ARTERIAL

## 2019-06-29 MED ORDER — LIDOCAINE HCL 1 % IJ SOLN
INTRAMUSCULAR | Status: AC
  Filled 2019-06-29: qty 20

## 2019-06-29 MED ORDER — LABETALOL HCL 5 MG/ML IV SOLN: 5.0000 mg | INTRAVENOUS | Status: DC | PRN

## 2019-06-29 MED ORDER — INSULIN ASPART 100 UNIT/ML ~~LOC~~ SOLN
0.0000 [IU] | Freq: Every day | SUBCUTANEOUS | Status: DC
  Administered 2019-06-29 – 2019-06-30 (×2): 3 [IU] via SUBCUTANEOUS

## 2019-06-29 MED ORDER — FENTANYL CITRATE (PF) 100 MCG/2ML IJ SOLN
INTRAMUSCULAR | Status: AC
  Filled 2019-06-29: qty 2

## 2019-06-29 NOTE — Progress Notes (Signed)
Transcranial Doppler  Date POD PCO2 HCT BP  MCA ACA PCA OPHT SIPH VERT Basilar  10/30     Right  Left   51  61   -19  -34   27  13   16  20    41  41   -12  -12   23           Right  Left                                            Right  Left                                             Right  Left                                             Right  Left                                            Right  Left                                            Right  Left                                        MCA = Middle Cerebral Artery      OPHT = Opthalmic Artery     BASILAR = Basilar Artery   ACA = Anterior Cerebral Artery     SIPH = Carotid Siphon PCA = Posterior Cerebral Artery   VERT = Verterbral Artery                   Normal MCA = 62+\-12 ACA = 50+\-12 PCA = 42+\-23

## 2019-06-29 NOTE — Progress Notes (Signed)
SLP Cancellation Note  Patient Details Name: Carly Hicks MRN: [id number] DOB: 10-Sep-1970   Cancelled treatment:       Reason Eval/Treat Not Completed: Patient at procedure or test/unavailable. Will f/u for cognitive linguistic interventions when appropriate.    Jartavious Mckimmy, Katherene Ponto 06/29/2019, 11:28 AM

## 2019-06-29 NOTE — Evaluation (Signed)
Physical Therapy Evaluation Patient Details Name: Carly Hicks MRN: [id number] DOB: 19-Jul-1971 Today's Date: 06/29/2019   History of Present Illness  Carly Hicks is a 48 y.o. female admitted with severe headache, stat CTA and was found to have a large subarachnoid hemorrhage involving the basilar cisterns and sylvian fissures.  Admitted to ICU for close monitoring and felt to have ventriculomegaly without signs of hydrocephalus.  Planned for cerebral angiogram later today.  Clinical Impression  Patient presents with mobility slightly limited due to BP regulation and decreased activity tolerance and mild balance deficits with BP up to 157 with mobility.  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  She will benefit from skilled PT in the acute setting to address these deficits and plan for d/c home with family support and no follow up PT.      Follow Up Recommendations No PT follow up    Equipment Recommendations  None recommended by PT    Recommendations for Other Services       Precautions / Restrictions Precautions Precautions: Other (comment) Precaution Comments: SBP <140      Mobility  Bed Mobility Overal bed mobility: Modified Independent                Transfers Overall transfer level: Modified independent                  Ambulation/Gait Ambulation/Gait assistance: Supervision Gait Distance (Feet): 130 Feet Assistive device: None Gait Pattern/deviations: Step-through pattern;Decreased stride length     General Gait Details: slow pace, but steady  Stairs Stairs: Yes Stairs assistance: Min guard Stair Management: One rail Left;Alternating pattern;Forwards Number of Stairs: 3 General stair comments: self selected pattern, holding rail and assist for lines, cues for slow cautious technique  Wheelchair Mobility    Modified Rankin (Stroke Patients  Only) Modified Rankin (Stroke Patients Only) Pre-Morbid Rankin Score: No symptoms Modified Rankin: Moderately severe disability     Balance Overall balance assessment: Mild deficits observed, not formally tested                                           Pertinent Vitals/Pain Pain Assessment: 0-10 Pain Score: 2  Pain Location: back of head Pain Descriptors / Indicators: Aching Pain Intervention(s): Monitored during session;Repositioned    Home Living Family/patient expects to be discharged to:: Private residence Living Arrangements: Spouse/significant other;Children Available Help at Discharge: Family Type of Home: House Home Access: Stairs to enter Entrance Stairs-Rails: Right Entrance Stairs-Number of Steps: 3 Home Layout: Multi-level;Bed/bath upstairs Home Equipment: None      Prior Function Level of Independence: Independent         Comments: works from home on [employer]        Extremity/Trunk Assessment   Upper Extremity Assessment Upper Extremity Assessment: Overall WFL for tasks assessed    Lower Extremity Assessment Lower Extremity Assessment: Overall WFL for tasks assessed       Communication   Communication: No difficulties  Cognition Arousal/Alertness: Awake/alert Behavior During Therapy: WFL for tasks assessed/performed Overall Cognitive Status: Within Functional Limits for tasks assessed  General Comments General comments (skin integrity, edema, etc.): BP 157/86 after ambulation, RN made aware and titrating up her Cleviprex drip; c/o tension in upper traps applied heat packs end of session    Exercises     Assessment/Plan    PT Assessment Patient needs continued PT services  PT Problem List Decreased activity tolerance;Decreased mobility;Decreased balance       PT Treatment Interventions Stair training;DME instruction;Therapeutic  activities;Balance training;Functional mobility training;Therapeutic exercise    PT Goals (Current goals can be found in the Care Plan section)  Acute Rehab PT Goals Patient Stated Goal: to return to independent PT Goal Formulation: With patient Time For Goal Achievement: 07/12/19 Potential to Achieve Goals: Good    Frequency Min 4X/week   Barriers to discharge        Co-evaluation               AM-PAC PT "6 Clicks" Mobility  Outcome Measure Help needed turning from your back to your side while in a flat bed without using bedrails?: None Help needed moving from lying on your back to sitting on the side of a flat bed without using bedrails?: None Help needed moving to and from a bed to a chair (including a wheelchair)?: None Help needed standing up from a chair using your arms (e.g., wheelchair or bedside chair)?: None Help needed to walk in hospital room?: A Little Help needed climbing 3-5 steps with a railing? : A Little 6 Click Score: 22    End of Session Equipment Utilized During Treatment: Gait belt Activity Tolerance: Patient tolerated treatment well Patient left: in bed;with call bell/phone within reach   PT Visit Diagnosis: Other abnormalities of gait and mobility (R26.89)    Time: 9628-3662 PT Time Calculation (min) (ACUTE ONLY): 24 min   Charges:   PT Evaluation $PT Eval Low Complexity: 1 Low PT Treatments $Gait Training: 8-22 mins        Sheran Lawless, PT Acute Rehabilitation Services [phone] 06/29/2019   [name] 06/29/2019, 11:47 AM

## 2019-06-29 NOTE — Sedation Documentation (Signed)
Right femoral sheath removed. 5 Fr. Exoseal deployed 

## 2019-06-29 NOTE — Transfer of Care (Signed)
Immediate Anesthesia Transfer of Care Note  Patient: Carly Hicks  Procedure(s) Performed: IR WITH ANESTHESIA (N/A )  Patient Location: ICU  Anesthesia Type:MAC  Level of Consciousness: awake, oriented and patient cooperative  Airway & Oxygen Therapy: Patient Spontanous Breathing  Post-op Assessment: Report given to RN and Post -op Vital signs reviewed and stable  Post vital signs: Reviewed and stable  Last Vitals:  Vitals Value Taken Time  BP 137/73 06/29/19 1606  Temp    Pulse 105 06/29/19 1609  Resp 22 06/29/19 1609  SpO2 99 % 06/29/19 1609  Vitals shown include unvalidated device data.  Last Pain:  Vitals:   06/29/19 1200  TempSrc:   PainSc: 2       Patients Stated Pain Goal: 0 (77/41/28 7867)  Complications: No apparent anesthesia complications

## 2019-06-29 NOTE — Evaluation (Addendum)
Occupational Therapy Evaluation Patient Details Name: Carly Hicks MRN: [id number] DOB: 17-Apr-1971 Today's Date: 06/29/2019    History of Present Illness Carly Hicks is a 48 y.o. female admitted with severe headache, stat CTA and was found to have a large subarachnoid hemorrhage involving the basilar cisterns and sylvian fissures.  Admitted to ICU for close monitoring and felt to have ventriculomegaly without signs of hydrocephalus.  Planned for cerebral angiogram later today.   Clinical Impression   This 48 y/o female presents with the above. PTA pt reports very independent with ADL, iADL and functional mobility. Pt performing functional transfers during session with minguard- light minA. She currently requires minA with LB ADL, setup/supervision for seated UB ADL. RN present during session and assisting with monitoring BP throughout. SBP 154 post toileting ADL via BSC, 146 when retaken approx 15 min after. Suspect pt to progress well once BP controlled and able to participate in further activity. She will benefit from continued acute OT services, currently recommend followup HHOT services though pending progress may be able to return home with no further OT needs. Will continue to follow acutely.    Follow Up Recommendations  Home health OT;Supervision/Assistance - 24 hour(suspect will progress to no follow up)    Equipment Recommendations  None recommended by OT(to be further assessed)           Precautions / Restrictions Precautions Precautions: Other (comment) Precaution Comments: SBP <140 Restrictions Weight Bearing Restrictions: No      Mobility Bed Mobility Overal bed mobility: Modified Independent                Transfers Overall transfer level: Needs assistance Equipment used: None Transfers: Sit to/from Stand;Stand Pivot Transfers Sit to Stand: Min guard;Supervision Stand pivot transfers: Min assist;Min guard       General transfer comment: light  minA for initial transfer to [facility], minguard assist for transfer back to bed    Balance Overall balance assessment: Needs assistance Sitting-balance support: Feet supported Sitting balance-Leahy Scale: Good     Standing balance support: During functional activity;No upper extremity supported Standing balance-Leahy Scale: Fair                             ADL either performed or assessed with clinical judgement   ADL Overall ADL's : Needs assistance/impaired Eating/Feeding: NPO Eating/Feeding Details (indicate cue type and reason): for procedure today, otherwise independent  Grooming: Set up;Sitting;Wash/dry hands   Upper Body Bathing: Set up;Sitting   Lower Body Bathing: Min guard;Sit to/from stand   Upper Body Dressing : Set up;Sitting   Lower Body Dressing: Minimal assistance;Sit to/from stand Lower Body Dressing Details (indicate cue type and reason): assist to doff pants while seated on BSC today Toilet Transfer: Minimal assistance;Min guard;Stand-pivot;BSC Toilet Transfer Details (indicate cue type and reason): minA initially, minguard for transfer back to bed from [facility], use of BSC vs toilet in bathroom as pt still with fluctating BP and difficult to maintain <140 (RN aware and okay with up to [facility]) Toileting- Clothing Manipulation and Hygiene: Min guard;Sit to/from stand;Sitting/lateral lean Toileting - Clothing Manipulation Details (indicate cue type and reason): pt performing pericare and clothing management with setup/minguard assist     Functional mobility during ADLs: Min guard;Minimal assistance General ADL Comments: limited session due to wanting to ensure BP remained <140, SBP 154 after return to supine post toileting ADL, SBP 146 thereafter when retaken  Pertinent Vitals/Pain Pain Assessment: Faces Pain Score: 2  Faces Pain Scale: Hurts little more Pain Location: back of head, traps Pain Descriptors / Indicators:  Aching Pain Intervention(s): Limited activity within patient's tolerance;Monitored during session;Repositioned     Hand Dominance     Extremity/Trunk Assessment Upper Extremity Assessment Upper Extremity Assessment: Overall WFL for tasks assessed   Lower Extremity Assessment Lower Extremity Assessment: Defer to PT evaluation;Overall WFL for tasks assessed       Communication Communication Communication: No difficulties   Cognition Arousal/Alertness: Awake/alert Behavior During Therapy: WFL for tasks assessed/performed Overall Cognitive Status: Within Functional Limits for tasks assessed                                     General Comments  RN present during session as she was still working on titrating Cleviprex to decrease SBP, SBP 154 post toileting ADL, 146 when retaken approx 15 min later    Exercises     Shoulder Instructions      Home Living Family/patient expects to be discharged to:: Private residence Living Arrangements: Spouse/significant other;Children Available Help at Discharge: Family Type of Home: House Home Access: Stairs to enter Technical brewer of Steps: 3 Entrance Stairs-Rails: Right Home Layout: Multi-level;Bed/bath upstairs Alternate Level Stairs-Number of Steps: 15 Alternate Level Stairs-Rails: Left Bathroom Shower/Tub: Occupational psychologist: Standard     Home Equipment: None          Prior Functioning/Environment Level of Independence: Independent        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        OT Problem List: Decreased strength;Decreased activity tolerance;Impaired balance (sitting and/or standing);Pain      OT Treatment/Interventions: Self-care/ADL training;Therapeutic exercise;Energy conservation;DME and/or AE instruction;Therapeutic activities;Patient/family education;Balance training    OT Goals(Current goals can be found in the care plan section) Acute Rehab OT Goals Patient Stated  Goal: to return to independent OT Goal Formulation: With patient Time For Goal Achievement: 07/13/19 Potential to Achieve Goals: Good  OT Frequency: Min 2X/week   Barriers to D/C:            Co-evaluation              AM-PAC OT "6 Clicks" Daily Activity     Outcome Measure Help from another person eating meals?: None Help from another person taking care of personal grooming?: A Little Help from another person toileting, which includes using toliet, bedpan, or urinal?: A Little Help from another person bathing (including washing, rinsing, drying)?: A Little Help from another person to put on and taking off regular upper body clothing?: None Help from another person to put on and taking off regular lower body clothing?: A Little 6 Click Score: 20   End of Session Nurse Communication: Mobility status  Activity Tolerance: Patient tolerated treatment well Patient left: in bed;with call bell/phone within reach;with nursing/sitter in room  OT Visit Diagnosis: Other symptoms and signs involving the nervous system (R29.898);Unsteadiness on feet (R26.81)                Time: 1202-1218 OT Time Calculation (min): 16 min Charges:  OT General Charges $OT Visit: 1 Visit OT Evaluation $OT Eval Moderate Complexity: [place], OT [employer] Pager [phone] Office [place] 06/29/2019, 2:38 PM

## 2019-06-29 NOTE — Anesthesia Preprocedure Evaluation (Addendum)
Anesthesia Evaluation  Patient identified by MRN, date of birth, ID band Patient awake    Reviewed: Allergy & Precautions, NPO status , Patient's Chart, lab work & pertinent test results  Airway Mallampati: II  TM Distance: >3 FB Neck ROM: Full    Dental no notable dental hx.    Pulmonary    Pulmonary exam normal        Cardiovascular negative cardio ROS Normal cardiovascular exam     Neuro/Psych negative psych ROS   GI/Hepatic   Endo/Other  diabetes  Renal/GU      Musculoskeletal   Abdominal Normal abdominal exam  (+)   Peds  Hematology   Anesthesia Other Findings   Reproductive/Obstetrics                           Anesthesia Physical Anesthesia Plan  ASA: III  Anesthesia Plan: General   Post-op Pain Management:    Induction: Intravenous  PONV Risk Score and Plan: 3 and Ondansetron and Treatment may vary due to age or medical condition  Airway Management Planned: Oral ETT  Additional Equipment: Arterial line  Intra-op Plan:   Post-operative Plan: Extubation in OR  Informed Consent: I have reviewed the patients History and Physical, chart, labs and discussed the procedure including the risks, benefits and alternatives for the proposed anesthesia with the patient or authorized representative who has indicated his/her understanding and acceptance.     Dental advisory given  Plan Discussed with: CRNA  Anesthesia Plan Comments: (Will start with MAC for arteriogram only per surgeon request. )       Anesthesia Quick Evaluation

## 2019-06-29 NOTE — Brief Op Note (Signed)
  NEUROSURGERY BRIEF OPERATIVE  NOTE   PREOP DX: Subarachnoid Hemorrhage  POSTOP DX: Same  PROCEDURE: Diagnostic cerebral angiogram  SURGEON: Dr. Consuella Lose, MD  ANESTHESIA: IV Sedation with Local  EBL: Minimal  SPECIMENS: None  COMPLICATIONS: None  CONDITION: Stable to ICU  FINDINGS (Full report in [facility]): 1. No intracranial aneurysms, AVM, or fistulas seen. No vasospasm.

## 2019-06-29 NOTE — ED Notes (Signed)
ED TO INPATIENT HANDOFF REPORT  ED Nurse Name and Phone #: Buford Dresser Name/Age/Gender Carly Hicks 48 y.o. female Room/Bed: MH06/MH06  Code Status   Code Status: Full Code  Home/SNF/Other Home Patient oriented to: self, place, time and situation Is this baseline? Yes   Triage Complete: Triage complete  Chief Complaint HIGH BP  Triage Note Headache to the back of her head with neck pain that started 2 hours ago during a stressful phone call. Pt took "1 motrin" PTA   Allergies No Known Allergies  Level of Care/Admitting Diagnosis ED Disposition    ED Disposition Condition Comment   Admit  Hospital Area: [facility] [[id number]]  Level of Care: ICU [6]  Covid Evaluation: Asymptomatic Screening Protocol (No Symptoms)  Diagnosis: Subarachnoid hemorrhage (HCC) [430.ICD-9-CM]  Admitting Physician: Lisbeth Renshaw [[id number]]  Attending Physician: Lisbeth Renshaw [[id number]]  Estimated length of stay: > 1 week  Certification:: I certify this patient will need inpatient services for at least 2 midnights  Bed request comments: 4N  PT Class (Do Not Modify): Inpatient [101]  PT Acc Code (Do Not Modify): Private [1]       B Medical/Surgery History Past Medical History:  Diagnosis Date  . ASCUS (atypical squamous cells of undetermined significance) on Pap smear   . Gestational diabetes   . H/O sickle cell trait   . History of anal fissures   . HSV-2 infection   . Trichimoniasis 1991  . Yeast infection    h/o   Past Surgical History:  Procedure Laterality Date  . COLPOSCOPY    . LEEP  2000  . WISDOM TOOTH EXTRACTION       A IV Location/Drains/Wounds Patient Lines/Drains/Airways Status   Active Line/Drains/Airways    Name:   Placement date:   Placement time:   Site:   Days:   Peripheral IV 06/28/19 Left Antecubital   06/28/19    2120    Antecubital   1   Peripheral IV 06/28/19 Right Antecubital   06/28/19    2235    Antecubital   1           Intake/Output Last 24 hours  Intake/Output Summary (Last 24 hours) at 06/29/2019 0044 Last data filed at 06/28/2019 2221 Gross per 24 hour  Intake 500 ml  Output -  Net 500 ml    Labs/Imaging Results for orders placed or performed during the hospital encounter of 06/28/19 (from the past 48 hour(s))  Basic metabolic panel     Status: Abnormal   Collection Time: 06/28/19 10:22 PM  Result Value Ref Range   Sodium 136 135 - 145 mmol/L   Potassium 4.3 3.5 - 5.1 mmol/L   Chloride 101 98 - 111 mmol/L   CO2 23 22 - 32 mmol/L   Glucose, Bld 302 (H) 70 - 99 mg/dL   BUN 9 6 - 20 mg/dL   Creatinine, Ser 6.21 0.44 - 1.00 mg/dL   Calcium 9.2 8.9 - 30.8 mg/dL   GFR calc non Af Amer >60 >60 mL/min   GFR calc Af Amer >60 >60 mL/min   Anion gap 12 5 - 15    Comment: Performed at [facility], [address] [place], [place] [address]  CBC with Differential/Platelet     Status: Abnormal   Collection Time: 06/28/19 10:22 PM  Result Value Ref Range   WBC 17.3 (H) 4.0 - 10.5 K/uL   RBC 4.83 3.87 - 5.11 MIL/uL  Hemoglobin 12.2 12.0 - 15.0 g/dL   HCT 16.9 67.8 - 93.8 %   MCV 77.8 (L) 80.0 - 100.0 fL   MCH 25.3 (L) 26.0 - 34.0 pg   MCHC 32.4 30.0 - 36.0 g/dL   RDW 10.1 75.1 - 02.5 %   Platelets 209 150 - 400 K/uL   nRBC 0.0 0.0 - 0.2 %   Neutrophils Relative % 95 %   Neutro Abs 16.4 (H) 1.7 - 7.7 K/uL   Lymphocytes Relative 2 %   Lymphs Abs 0.4 (L) 0.7 - 4.0 K/uL   Monocytes Relative 2 %   Monocytes Absolute 0.4 0.1 - 1.0 K/uL   Eosinophils Relative 0 %   Eosinophils Absolute 0.0 0.0 - 0.5 K/uL   Basophils Relative 0 %   Basophils Absolute 0.0 0.0 - 0.1 K/uL   Immature Granulocytes 1 %   Abs Immature Granulocytes 0.09 (H) 0.00 - 0.07 K/uL    Comment: Performed at [facility], [address] [place], [place] [address]  Pregnancy, urine     Status: None   Collection Time: 06/28/19 10:22 PM  Result Value Ref Range   Preg Test, Ur NEGATIVE NEGATIVE     Comment:        THE SENSITIVITY OF THIS METHODOLOGY IS >20 mIU/mL. Performed at [facility], [address] [place], [place] [address]    Ct Angio Head W Or Wo Contrast  Result Date: 06/28/2019 CLINICAL DATA:  Thunderclap headache EXAM: CT ANGIOGRAPHY HEAD AND NECK TECHNIQUE: Multidetector CT imaging of the head and neck was performed using the standard protocol during bolus administration of intravenous contrast. Multiplanar CT image reconstructions and MIPs were obtained to evaluate the vascular anatomy. Carotid stenosis measurements (when applicable) are obtained utilizing NASCET criteria, using the distal internal carotid diameter as the denominator. CONTRAST:  OMNIPAQUE IOHEXOL 350 MG/ML SOLN COMPARISON:  None. FINDINGS: CT HEAD FINDINGS Brain: There is a large amount of subarachnoid hemorrhage within the basal cisterns and sylvian fissures. The lateral ventricles are dilated. No evidence of transependymal interstitial edema. There is no midline shift. Skull: The visualized skull base, calvarium and extracranial soft tissues are normal. Sinuses/Orbits: No fluid levels or advanced mucosal thickening of the visualized paranasal sinuses. No mastoid or middle ear effusion. The orbits are normal. CTA NECK FINDINGS SKELETON: There is no bony spinal canal stenosis. No lytic or blastic lesion. OTHER NECK: Normal pharynx, larynx and major salivary glands. No cervical lymphadenopathy. Unremarkable thyroid gland. UPPER CHEST: No pneumothorax or pleural effusion. No nodules or masses. AORTIC ARCH: There is no calcific atherosclerosis of the aortic arch. There is no aneurysm, dissection or hemodynamically significant stenosis of the visualized portion of the aorta. Conventional 3 vessel aortic branching pattern. The visualized proximal subclavian arteries are widely patent. RIGHT CAROTID SYSTEM: Normal without aneurysm, dissection or stenosis. LEFT CAROTID SYSTEM: Normal without aneurysm,  dissection or stenosis. VERTEBRAL ARTERIES: Left dominant configuration. Both origins are clearly patent. There is no dissection, occlusion or flow-limiting stenosis to the skull base (V1-V3 segments). CTA HEAD FINDINGS POSTERIOR CIRCULATION: --Vertebral arteries: Normal V4 segments. --Posterior inferior cerebellar arteries (PICA): Patent origins from the vertebral arteries. --Anterior inferior cerebellar arteries (AICA): Patent origins from the basilar artery. --Basilar artery: Normal. --Superior cerebellar arteries: Normal. --Posterior cerebral arteries: Normal. There are bilateral posterior communicating arteries (p-comm) that partially supply the PCAs. ANTERIOR CIRCULATION: --Intracranial internal carotid arteries: Normal. --Anterior cerebral arteries (ACA): Normal. Both A1 segments are present. Patent anterior communicating  artery (a-comm). --Middle cerebral arteries (MCA): Normal. There is no aneurysm visualized. There appears to a concentration of subarachnoid blood adjacent to the cavernous segment of the right ICA, although this might also represent asymmetric cavernous sinus enhancement. VENOUS SINUSES: As permitted by contrast timing, patent. ANATOMIC VARIANTS: None Review of the MIP images confirms the above findings. IMPRESSION: 1. Large amount of subarachnoid hemorrhage within the basal cisterns and Sylvian fissures. Dilated ventricles without evidence of transependymal interstitial edema. 2. No aneurysm visualized. Concentration of subarachnoid blood along the medial aspect of the cavernous segment of the right ICA vs asymmetric cavernous sinus enhancement. 3. No arterial occlusion. Critical Value/emergent results were called by telephone at the time of interpretation on 06/28/2019 at 11:28 pm to [place] , who verbally acknowledged these results. Electronically Signed   By: Ulyses Jarred M.D.   On: 06/28/2019 23:39   Ct Angio Neck W And/or Wo Contrast  Result Date:  06/28/2019 CLINICAL DATA:  Thunderclap headache EXAM: CT ANGIOGRAPHY HEAD AND NECK TECHNIQUE: Multidetector CT imaging of the head and neck was performed using the standard protocol during bolus administration of intravenous contrast. Multiplanar CT image reconstructions and MIPs were obtained to evaluate the vascular anatomy. Carotid stenosis measurements (when applicable) are obtained utilizing NASCET criteria, using the distal internal carotid diameter as the denominator. CONTRAST:  140mL OMNIPAQUE IOHEXOL 350 MG/ML SOLN COMPARISON:  None. FINDINGS: CT HEAD FINDINGS Brain: There is a large amount of subarachnoid hemorrhage within the basal cisterns and sylvian fissures. The lateral ventricles are dilated. No evidence of transependymal interstitial edema. There is no midline shift. Skull: The visualized skull base, calvarium and extracranial soft tissues are normal. Sinuses/Orbits: No fluid levels or advanced mucosal thickening of the visualized paranasal sinuses. No mastoid or middle ear effusion. The orbits are normal. CTA NECK FINDINGS SKELETON: There is no bony spinal canal stenosis. No lytic or blastic lesion. OTHER NECK: Normal pharynx, larynx and major salivary glands. No cervical lymphadenopathy. Unremarkable thyroid gland. UPPER CHEST: No pneumothorax or pleural effusion. No nodules or masses. AORTIC ARCH: There is no calcific atherosclerosis of the aortic arch. There is no aneurysm, dissection or hemodynamically significant stenosis of the visualized portion of the aorta. Conventional 3 vessel aortic branching pattern. The visualized proximal subclavian arteries are widely patent. RIGHT CAROTID SYSTEM: Normal without aneurysm, dissection or stenosis. LEFT CAROTID SYSTEM: Normal without aneurysm, dissection or stenosis. VERTEBRAL ARTERIES: Left dominant configuration. Both origins are clearly patent. There is no dissection, occlusion or flow-limiting stenosis to the skull base (V1-V3 segments). CTA HEAD  FINDINGS POSTERIOR CIRCULATION: --Vertebral arteries: Normal V4 segments. --Posterior inferior cerebellar arteries (PICA): Patent origins from the vertebral arteries. --Anterior inferior cerebellar arteries (AICA): Patent origins from the basilar artery. --Basilar artery: Normal. --Superior cerebellar arteries: Normal. --Posterior cerebral arteries: Normal. There are bilateral posterior communicating arteries (p-comm) that partially supply the PCAs. ANTERIOR CIRCULATION: --Intracranial internal carotid arteries: Normal. --Anterior cerebral arteries (ACA): Normal. Both A1 segments are present. Patent anterior communicating artery (a-comm). --Middle cerebral arteries (MCA): Normal. There is no aneurysm visualized. There appears to a concentration of subarachnoid blood adjacent to the cavernous segment of the right ICA, although this might also represent asymmetric cavernous sinus enhancement. VENOUS SINUSES: As permitted by contrast timing, patent. ANATOMIC VARIANTS: None Review of the MIP images confirms the above findings. IMPRESSION: 1. Large amount of subarachnoid hemorrhage within the basal cisterns and Sylvian fissures. Dilated ventricles without evidence of transependymal interstitial edema. 2. No aneurysm visualized. Concentration of subarachnoid blood along the  medial aspect of the cavernous segment of the right ICA vs asymmetric cavernous sinus enhancement. 3. No arterial occlusion. Critical Value/emergent results were called by telephone at the time of interpretation on 06/28/2019 at 11:28 pm to providerCLAUDIA GIBBONS , who verbally acknowledged these results. Electronically Signed   By: Deatra RobinsonKevin  Herman M.D.   On: 06/28/2019 23:39    Pending Labs Unresulted Labs (From admission, onward)    Start     Ordered   06/29/19 0036  SARS Coronavirus 2 by RT PCR (hospital order, [facility] Nasopharyngeal Nasopharyngeal Swab  (Symptomatic/High Risk of Exposure/Tier 1 Patients Labs with  Precautions)  Once,   STAT    Question Answer Comment  Is this test for diagnosis or screening Screening   Symptomatic for COVID-19 as defined by CDC No   Hospitalized for COVID-19 No   Admitted to ICU for COVID-19 No   Previously tested for COVID-19 No   Resident in a congregate (group) care setting No   Employed in healthcare setting No   Pregnant No      06/29/19 0035   06/28/19 2357  HIV Antibody (routine testing w rflx)  (HIV Antibody (Routine testing w reflex) panel)  Once,   STAT     06/28/19 2359   06/28/19 2357  CBC  Once,   STAT     06/28/19 2359   06/28/19 2357  Protime-INR  Once,   STAT     06/28/19 2359   06/28/19 2357  APTT  Once,   STAT     06/28/19 2359   06/28/19 2357  Urine rapid drug screen (hosp performed)  Once,   STAT     06/28/19 2359          Vitals/Pain Today's Vitals   06/28/19 2016 06/28/19 2236 06/28/19 2352 06/28/19 2357  BP: (!) 164/84  127/66   Pulse: 98  92   Resp: 18  14   Temp:      TempSrc:      SpO2: 100%  99%   Weight:      Height:      PainSc: 10-Worst pain ever 6  6  6      Isolation Precautions No active isolations  Medications Medications  butalbital-acetaminophen-caffeine (FIORICET) 50-325-40 MG per tablet 1-2 tablet (has no administration in time range)  oxyCODONE (Oxy IR/ROXICODONE) immediate release tablet 5 mg (has no administration in time range)  calcium carbonate (OS-CAL - dosed in mg of elemental calcium) tablet 250 mg of elemental calcium (has no administration in time range)  prenatal multivitamin tablet 1 tablet (has no administration in time range)  cyclobenzaprine (FLEXERIL) tablet 10 mg (has no administration in time range)   stroke: mapping our early stages of recovery book (has no administration in time range)  0.9 %  sodium chloride infusion (has no administration in time range)  acetaminophen (TYLENOL) tablet 650 mg (has no administration in time range)    Or  acetaminophen (TYLENOL) 160 MG/5ML solution  650 mg (has no administration in time range)    Or  acetaminophen (TYLENOL) suppository 650 mg (has no administration in time range)  docusate sodium (COLACE) capsule 100 mg (has no administration in time range)  ondansetron (ZOFRAN-ODT) disintegrating tablet 4 mg (has no administration in time range)    Or  ondansetron (ZOFRAN) injection 4 mg (has no administration in time range)  niMODipine (NIMOTOP) capsule 60 mg (has no administration in time range)    Or  niMODipine (NYMALIZE)  6 MG/ML oral solution 60 mg (has no administration in time range)  acetaminophen-codeine (TYLENOL #3) 300-30 MG per tablet 1-2 tablet (has no administration in time range)  clevidipine (CLEVIPREX) infusion 0.5 mg/mL (has no administration in time range)  levETIRAcetam (KEPPRA) IVPB 500 mg/100 mL premix (500 mg Intravenous New Bag/Given 06/29/19 0034)  ketorolac (TORADOL) 30 MG/ML injection 30 mg (30 mg Intravenous Given 06/28/19 2122)  metoCLOPramide (REGLAN) injection 10 mg (10 mg Intravenous Given 06/28/19 2122)  acetaminophen (TYLENOL) tablet 1,000 mg (1,000 mg Oral Given 06/28/19 2122)  sodium chloride 0.9 % bolus 500 mL (0 mLs Intravenous Stopped 06/28/19 2221)  methocarbamol (ROBAXIN) injection 500 mg (500 mg Intramuscular Given 06/28/19 2122)  iohexol (OMNIPAQUE) 350 MG/ML injection 100 mL (100 mLs Intravenous Contrast Given 06/28/19 2301)    Mobility walks Low fall risk   Focused Assessments Neuro Assessment Handoff:  Swallow screen pass? No          Neuro Assessment: Exceptions to WDL Neuro Checks:      Last Documented NIHSS Modified Score:   Has TPA been given? No If patient is a Neuro Trauma and patient is going to OR before floor call report to 4N Charge nurse: [phone] or [phone]     R Recommendations: See Admitting Provider Note  Report given to:   Additional Notes:

## 2019-06-29 NOTE — Anesthesia Procedure Notes (Signed)
Date/Time: 06/29/2019 2:47 PM Performed by: Shirlyn Goltz, CRNA Pre-anesthesia Checklist: Patient identified, Emergency Drugs available, Suction available, Patient being monitored and Timeout performed Patient Re-evaluated:Patient Re-evaluated prior to induction Oxygen Delivery Method: Nasal cannula Preoxygenation: Pre-oxygenation with 100% oxygen Induction Type: IV induction Placement Confirmation: positive ETCO2

## 2019-06-29 NOTE — H&P (Signed)
Chief Complaint   Chief Complaint  Patient presents with   Headache    HPI   Consult requested by: Sharen Hecklaudia Gibbons, PA-C (EDP) Reason for consult: non-truamatic SAH  HPI: Carly Hicks is a 48 y.o. female who presented to [facility] with acute onset headache and neck pain at 1500 today after being on a zoom call. She called her son who gave her a motrin without relief. Pain became debilitating prompting evaluation.  She initially went to an urgent care but was unable to get out of the car on her own.  Her blood pressure was checked by staff there and it was greater than 180 systolic.  She was subsequently advised to go to the emergency room.  [facility] was [facility] and [facility]. She underwent a stat CTA and was found to have a large subarachnoid hemorrhage involving the basilar cisterns and sylvian fissures.  Neurosurgery was called for admission. She complains of severe headache and neck pain. No associated changes in vision or weakness.   History of iron deficiency and prediabetes. No history of smoking. No family history of aneurysm.  Patient Active Problem List   Diagnosis Date Noted   Subarachnoid hemorrhage (HCC) 06/28/2019    PMH: Past Medical History:  Diagnosis Date   ASCUS (atypical squamous cells of undetermined significance) on Pap smear    Gestational diabetes    H/O sickle cell trait    History of anal fissures    HSV-2 infection    Trichimoniasis 1991   Yeast infection    h/o    PSH: Past Surgical History:  Procedure Laterality Date   COLPOSCOPY     LEEP  2000   WISDOM TOOTH EXTRACTION      (Not in a hospital admission)   SH: Social History   Tobacco Use   Smoking status: Never Smoker   Smokeless tobacco: Never Used  Substance Use Topics   Alcohol use: No   Drug use: No    MEDS: Prior to Admission medications   Medication Sig Start Date End Date Taking? Authorizing Provider  calcium carbonate  200 MG capsule Take 250 mg by mouth 2 (two) times daily with a meal. Pt is taking chewable calcium    [provider]  cyclobenzaprine (FLEXERIL) 10 MG tablet Take 1 tablet (10 mg total) by mouth 2 (two) times daily as needed for muscle spasms. 03/13/18   Little, Ambrose Finlandachel Morgan, MD  IRON PO Take by mouth.    [provider]  Prenatal MV-Min-Fe Fum-FA-DHA (PRENATAL 1 PO) Take by mouth.    [provider]  valACYclovir (VALTREX) 500 MG tablet Take 1 tablet (500 mg total) by mouth 2 (two) times daily. TAKE ONE PO TWO TIMES A DAY X 3 DAYS AS NEEDED FOR OUTBREAK. 10/24/12   Henreitta LeberPowell, [place], PA-C    ALLERGY: No Known Allergies  Social History   Tobacco Use   Smoking status: Never Smoker   Smokeless tobacco: Never Used  Substance Use Topics   Alcohol use: No     Family History  Problem Relation Age of Onset   Diabetes Mother    Hyperlipidemia Mother    Thyroid disease Mother        mom had parathyroid removed   Diabetes Maternal Aunt    Kidney disease Maternal Aunt    Diabetes Maternal Uncle    Diabetes Maternal Grandmother    Kidney disease Maternal Grandmother    Anemia Sister    Gestational diabetes  Sister    Colon cancer Paternal Aunt    Chronic Renal Failure Maternal Aunt    Cancer Maternal Aunt        breast     ROS   Review of Systems  Constitutional: Negative.   HENT: Negative.   Eyes: Negative for blurred vision, double vision and photophobia.  Respiratory: Negative.   Cardiovascular: Negative.   Gastrointestinal: Positive for nausea and vomiting (1 episode).  Genitourinary: Negative.   Musculoskeletal: Positive for back pain, joint pain, myalgias and neck pain. Negative for falls.  Skin: Negative.   Neurological: Positive for headaches. Negative for dizziness, tingling, tremors, sensory change, speech change, focal weakness, seizures, loss of consciousness and weakness.    Exam   Vitals:   06/28/19 2016 06/28/19 2352    BP: (!) 164/84 127/66  Pulse: 98 92  Resp: 18 14  Temp:    SpO2: 100% 99%   General appearance: WDWN, NAD, resting comfortably GCS: 15 Eyes: No scleral injection Cardiovascular: Regular rate and rhythm without murmurs, rubs, gallops. No edema or variciosities. Distal pulses normal. Pulmonary: Effort normal, non-labored breathing Musculoskeletal:     Muscle tone upper extremities: Normal    Muscle tone lower extremities: Normal    Motor exam: Upper Extremities Deltoid Bicep Tricep Grip  Right 5/5 5/5 5/5 5/5  Left 5/5 5/5 5/5 5/5   Lower Extremity IP Quad PF DF EHL  Right 5/5 5/5 5/5 5/5 5/5  Left 5/5 5/5 5/5 5/5 5/5   Neurological Mental Status:    - Patient is awake, alert, oriented to person, place, month, year, and situation    - Patient is able to give a clear and coherent history.    - No signs of aphasia or neglect Cranial Nerves    - II: Visual Fields are full. PERRL    - III/IV/VI: EOMI without ptosis or diploplia.     - V: Facial sensation is grossly normal    - VII: Facial movement is symmetric.     - VIII: hearing is intact to voice    - X: Uvula elevates symmetrically    - XI: Shoulder shrug is symmetric.    - XII: tongue is midline without atrophy or fasciculations.  Sensory: Sensation grossly intact to LT Cerebellar    - FNF and HKS are intact bilaterally  Results - Imaging/Labs   Results for orders placed or performed during the hospital encounter of 06/28/19 (from the past 48 hour(s))  Basic metabolic panel     Status: Abnormal   Collection Time: 06/28/19 10:22 PM  Result Value Ref Range   Sodium 136 135 - 145 mmol/L   Potassium 4.3 3.5 - 5.1 mmol/L   Chloride 101 98 - 111 mmol/L   CO2 23 22 - 32 mmol/L   Glucose, Bld 302 (H) 70 - 99 mg/dL   BUN 9 6 - 20 mg/dL   Creatinine, Ser 1.61 0.44 - 1.00 mg/dL   Calcium 9.2 8.9 - 09.6 mg/dL   GFR calc non Af Amer >60 >60 mL/min   GFR calc Af Amer >60 >60 mL/min   Anion gap 12 5 - 15    Comment:  Performed at [facility], [address] [place], [place] [address]  CBC with Differential/Platelet     Status: Abnormal   Collection Time: 06/28/19 10:22 PM  Result Value Ref Range   WBC 17.3 (H) 4.0 - 10.5 K/uL   RBC 4.83 3.87 - 5.11 MIL/uL   Hemoglobin  12.2 12.0 - 15.0 g/dL   HCT 09.9 83.3 - 82.5 %   MCV 77.8 (L) 80.0 - 100.0 fL   MCH 25.3 (L) 26.0 - 34.0 pg   MCHC 32.4 30.0 - 36.0 g/dL   RDW 05.3 97.6 - 73.4 %   Platelets 209 150 - 400 K/uL   nRBC 0.0 0.0 - 0.2 %   Neutrophils Relative % 95 %   Neutro Abs 16.4 (H) 1.7 - 7.7 K/uL   Lymphocytes Relative 2 %   Lymphs Abs 0.4 (L) 0.7 - 4.0 K/uL   Monocytes Relative 2 %   Monocytes Absolute 0.4 0.1 - 1.0 K/uL   Eosinophils Relative 0 %   Eosinophils Absolute 0.0 0.0 - 0.5 K/uL   Basophils Relative 0 %   Basophils Absolute 0.0 0.0 - 0.1 K/uL   Immature Granulocytes 1 %   Abs Immature Granulocytes 0.09 (H) 0.00 - 0.07 K/uL    Comment: Performed at [facility], [address] [place], [place] [address]  Pregnancy, urine     Status: None   Collection Time: 06/28/19 10:22 PM  Result Value Ref Range   Preg Test, Ur NEGATIVE NEGATIVE    Comment:        THE SENSITIVITY OF THIS METHODOLOGY IS >20 mIU/mL. Performed at [facility], [address] [place], [place] [address]     Ct Angio Head W Or Wo Contrast  Result Date: 06/28/2019 CLINICAL DATA:  Thunderclap headache EXAM: CT ANGIOGRAPHY HEAD AND NECK TECHNIQUE: Multidetector CT imaging of the head and neck was performed using the standard protocol during bolus administration of intravenous contrast. Multiplanar CT image reconstructions and MIPs were obtained to evaluate the vascular anatomy. Carotid stenosis measurements (when applicable) are obtained utilizing NASCET criteria, using the distal internal carotid diameter as the denominator. CONTRAST:  OMNIPAQUE IOHEXOL 350 MG/ML SOLN COMPARISON:  None. FINDINGS: CT HEAD FINDINGS Brain: There  is a large amount of subarachnoid hemorrhage within the basal cisterns and sylvian fissures. The lateral ventricles are dilated. No evidence of transependymal interstitial edema. There is no midline shift. Skull: The visualized skull base, calvarium and extracranial soft tissues are normal. Sinuses/Orbits: No fluid levels or advanced mucosal thickening of the visualized paranasal sinuses. No mastoid or middle ear effusion. The orbits are normal. CTA NECK FINDINGS SKELETON: There is no bony spinal canal stenosis. No lytic or blastic lesion. OTHER NECK: Normal pharynx, larynx and major salivary glands. No cervical lymphadenopathy. Unremarkable thyroid gland. UPPER CHEST: No pneumothorax or pleural effusion. No nodules or masses. AORTIC ARCH: There is no calcific atherosclerosis of the aortic arch. There is no aneurysm, dissection or hemodynamically significant stenosis of the visualized portion of the aorta. Conventional 3 vessel aortic branching pattern. The visualized proximal subclavian arteries are widely patent. RIGHT CAROTID SYSTEM: Normal without aneurysm, dissection or stenosis. LEFT CAROTID SYSTEM: Normal without aneurysm, dissection or stenosis. VERTEBRAL ARTERIES: Left dominant configuration. Both origins are clearly patent. There is no dissection, occlusion or flow-limiting stenosis to the skull base (V1-V3 segments). CTA HEAD FINDINGS POSTERIOR CIRCULATION: --Vertebral arteries: Normal V4 segments. --Posterior inferior cerebellar arteries (PICA): Patent origins from the vertebral arteries. --Anterior inferior cerebellar arteries (AICA): Patent origins from the basilar artery. --Basilar artery: Normal. --Superior cerebellar arteries: Normal. --Posterior cerebral arteries: Normal. There are bilateral posterior communicating arteries (p-comm) that partially supply the PCAs. ANTERIOR CIRCULATION: --Intracranial internal carotid arteries: Normal. --Anterior cerebral arteries (ACA): Normal. Both A1 segments are  present. Patent anterior communicating  artery (a-comm). --Middle cerebral arteries (MCA): Normal. There is no aneurysm visualized. There appears to a concentration of subarachnoid blood adjacent to the cavernous segment of the right ICA, although this might also represent asymmetric cavernous sinus enhancement. VENOUS SINUSES: As permitted by contrast timing, patent. ANATOMIC VARIANTS: None Review of the MIP images confirms the above findings. IMPRESSION: 1. Large amount of subarachnoid hemorrhage within the basal cisterns and Sylvian fissures. Dilated ventricles without evidence of transependymal interstitial edema. 2. No aneurysm visualized. Concentration of subarachnoid blood along the medial aspect of the cavernous segment of the right ICA vs asymmetric cavernous sinus enhancement. 3. No arterial occlusion. Critical Value/emergent results were called by telephone at the time of interpretation on 06/28/2019 at 11:28 pm to providerCLAUDIA GIBBONS , who verbally acknowledged these results. Electronically Signed   By: Deatra Robinson M.D.   On: 06/28/2019 23:39   Ct Angio Neck W And/or Wo Contrast  Result Date: 06/28/2019 CLINICAL DATA:  Thunderclap headache EXAM: CT ANGIOGRAPHY HEAD AND NECK TECHNIQUE: Multidetector CT imaging of the head and neck was performed using the standard protocol during bolus administration of intravenous contrast. Multiplanar CT image reconstructions and MIPs were obtained to evaluate the vascular anatomy. Carotid stenosis measurements (when applicable) are obtained utilizing NASCET criteria, using the distal internal carotid diameter as the denominator. CONTRAST:  OMNIPAQUE IOHEXOL 350 MG/ML SOLN COMPARISON:  None. FINDINGS: CT HEAD FINDINGS Brain: There is a large amount of subarachnoid hemorrhage within the basal cisterns and sylvian fissures. The lateral ventricles are dilated. No evidence of transependymal interstitial edema. There is no midline shift. Skull: The visualized  skull base, calvarium and extracranial soft tissues are normal. Sinuses/Orbits: No fluid levels or advanced mucosal thickening of the visualized paranasal sinuses. No mastoid or middle ear effusion. The orbits are normal. CTA NECK FINDINGS SKELETON: There is no bony spinal canal stenosis. No lytic or blastic lesion. OTHER NECK: Normal pharynx, larynx and major salivary glands. No cervical lymphadenopathy. Unremarkable thyroid gland. UPPER CHEST: No pneumothorax or pleural effusion. No nodules or masses. AORTIC ARCH: There is no calcific atherosclerosis of the aortic arch. There is no aneurysm, dissection or hemodynamically significant stenosis of the visualized portion of the aorta. Conventional 3 vessel aortic branching pattern. The visualized proximal subclavian arteries are widely patent. RIGHT CAROTID SYSTEM: Normal without aneurysm, dissection or stenosis. LEFT CAROTID SYSTEM: Normal without aneurysm, dissection or stenosis. VERTEBRAL ARTERIES: Left dominant configuration. Both origins are clearly patent. There is no dissection, occlusion or flow-limiting stenosis to the skull base (V1-V3 segments). CTA HEAD FINDINGS POSTERIOR CIRCULATION: --Vertebral arteries: Normal V4 segments. --Posterior inferior cerebellar arteries (PICA): Patent origins from the vertebral arteries. --Anterior inferior cerebellar arteries (AICA): Patent origins from the basilar artery. --Basilar artery: Normal. --Superior cerebellar arteries: Normal. --Posterior cerebral arteries: Normal. There are bilateral posterior communicating arteries (p-comm) that partially supply the PCAs. ANTERIOR CIRCULATION: --Intracranial internal carotid arteries: Normal. --Anterior cerebral arteries (ACA): Normal. Both A1 segments are present. Patent anterior communicating artery (a-comm). --Middle cerebral arteries (MCA): Normal. There is no aneurysm visualized. There appears to a concentration of subarachnoid blood adjacent to the cavernous segment of the  right ICA, although this might also represent asymmetric cavernous sinus enhancement. VENOUS SINUSES: As permitted by contrast timing, patent. ANATOMIC VARIANTS: None Review of the MIP images confirms the above findings. IMPRESSION: 1. Large amount of subarachnoid hemorrhage within the basal cisterns and Sylvian fissures. Dilated ventricles without evidence of transependymal interstitial edema. 2. No aneurysm visualized. Concentration of subarachnoid blood along the  medial aspect of the cavernous segment of the right ICA vs asymmetric cavernous sinus enhancement. 3. No arterial occlusion. Critical Value/emergent results were called by telephone at the time of interpretation on 06/28/2019 at 11:28 pm to [place] , who verbally acknowledged these results. Electronically Signed   By: Ulyses Jarred M.D.   On: 06/28/2019 23:39    Impression/Plan   48 y.o. female with Fisher 3 Hunt Hess 1/2 CTA negative nontraumatic subarachnoid hemorrhage.  She is neurologically intact. While CT does show ventriculomegaly, she does not exhibit signs and symptoms of hydrocephalus.  She does not need any emergent neurosurgical intervention at present although is at high risk for decompensation. She is admitted to the neuro ICU for close observation and further management and workup. Patient will need to undergo diagnostic cerebral angiogram by Dr. Kathyrn Sheriff.  Will keep NPO in preparation for possible angiogram this afternoon. - SBP goal <140, cleviprex prn - Nimotop for vasospasm prevention - frequent neuro checks - prediabetes: glucose checks, SSI  Ferne Reus, PA-C [facility]

## 2019-06-29 NOTE — Progress Notes (Signed)
Called Dr. Kathyrn Sheriff and he gave verbal orders to give pt 1200 Nimodipine.  If pt passes swallow, she is allowed to have dinner.

## 2019-06-30 ENCOUNTER — Encounter (HOSPITAL_COMMUNITY): Payer: Self-pay | Admitting: Neurosurgery

## 2019-06-30 LAB — GLUCOSE, CAPILLARY
Glucose-Capillary: 179 mg/dL — ABNORMAL HIGH (ref 70–99)
Glucose-Capillary: 373 mg/dL — ABNORMAL HIGH (ref 70–99)
Glucose-Capillary: 379 mg/dL — ABNORMAL HIGH (ref 70–99)
Glucose-Capillary: 294 mg/dL — ABNORMAL HIGH (ref 70–99)
Glucose-Capillary: 127 mg/dL — ABNORMAL HIGH (ref 70–99)
Glucose-Capillary: 282 mg/dL — ABNORMAL HIGH (ref 70–99)

## 2019-06-30 MED ORDER — INSULIN ASPART 100 UNIT/ML ~~LOC~~ SOLN
5.0000 [IU] | Freq: Once | SUBCUTANEOUS | Status: AC
  Administered 2019-06-30: 5 [IU] via SUBCUTANEOUS

## 2019-06-30 NOTE — Progress Notes (Signed)
NEUROSURGERY PROGRESS NOTE 48 year old female with a SAH and negative angiogram.  Doing well. Complains of appropriate headaches but says that she is doing a lot better today. Ambulated yesterday.   Temp:  [98.3 F (36.8 C)-99.5 F (37.5 C)] 98.3 F (36.8 C) (10/31 0800) Pulse Rate:  [72-111] 72 (10/31 0700) Resp:  [2-28] 14 (10/31 0700) BP: (98-154)/(51-82) 100/62 (10/31 0700) SpO2:  [95 %-100 %] 98 % (10/31 0700)  Plan: Continue observation in ICU. Planning for repeat angiogram by Dr. Kathyrn Sheriff.   Eleonore Chiquito, NP 06/30/2019 8:47 AM

## 2019-06-30 NOTE — Plan of Care (Signed)
  Problem: Spontaneous Subarachnoid Hemorrhage Tissue Perfusion: Goal: Complications of Spontaneous Subarachnoid Hemorrhage will be minimized Outcome: Progressing   

## 2019-06-30 NOTE — Progress Notes (Signed)
Physical Therapy Treatment Patient Details Name: Carly Hicks MRN: [id number] DOB: 12-11-1970 Today's Date: 06/30/2019    History of Present Illness Carly Hicks is a 48 y.o. female admitted with severe headache, stat CTA and was found to have a large subarachnoid hemorrhage involving the basilar cisterns and sylvian fissures.  Admitted to ICU for close monitoring and felt to have ventriculomegaly without signs of hydrocephalus.  Planned for cerebral angiogram later today.    PT Comments    Pt pleasant and very willing to mobilize. 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 Pt with improved gait tolerance with SBP maintained below 140 throughout. Pt with mild deficit with high level balance challenges and noted head ache with looking up but otherwise stable with gait and basic transfers. Pt encouraged to continue mobility with staff while attached to lines and will continue to follow acutely to maximize independence and balance prior to D/C.   BP in standing 99/74 (83) BP after stairs 118/70 (79)   Follow Up Recommendations  No PT follow up     Equipment Recommendations  None recommended by PT    Recommendations for Other Services       Precautions / Restrictions Precautions Precaution Comments: SBP <140    Mobility  Bed Mobility Overal bed mobility: Modified Independent                Transfers Overall transfer level: Modified independent               General transfer comment: pt able to stand from bed and toilet without assist, supervision only for lines  Ambulation/Gait Ambulation/Gait assistance: Supervision Gait Distance (Feet): 400 Feet Assistive device: None Gait Pattern/deviations: WFL(Within Functional Limits)   Gait velocity interpretation: >4.37 ft/sec, indicative of normal walking speed General Gait Details: gait included vertical and horizontal head turns, change of speed and direction   Stairs Stairs: Yes Stairs  assistance: Supervision Stair Management: One rail Left;Alternating pattern;Forwards Number of Stairs: 15 General stair comments: supervision for lines, use of rail without need for physical assist   Wheelchair Mobility    Modified Rankin (Stroke Patients Only) Modified Rankin (Stroke Patients Only) Pre-Morbid Rankin Score: No symptoms Modified Rankin: Slight disability     Balance Overall balance assessment: Modified Independent;Needs assistance Sitting-balance support: Feet supported Sitting balance-Leahy Scale: Normal       Standing balance-Leahy Scale: Good   Single Leg Stance - Right Leg: 10 Single Leg Stance - Left Leg: 10 Tandem Stance - Right Leg: 12 Tandem Stance - Left Leg: 20   Rhomberg - Eyes Closed: 30 High level balance activites: Direction changes;Turns;Head turns High Level Balance Comments: vertical and horizontal head turns and change of speed during gait without deficits or halting, pt able to turn 360degrees in less than 3 sec bil. pt able to perform narrowed and single limb stance without assist with slightly increased sway and pt reporting increased unsteadiness from baseline            Cognition Arousal/Alertness: Awake/alert Behavior During Therapy: WFL for tasks assessed/performed Overall Cognitive Status: Within Functional Limits for tasks assessed                                        Exercises      General Comments        Pertinent Vitals/Pain Pain Score: 3  Pain Location: posterior head  Pain Descriptors / Indicators: Aching Pain Intervention(s): Limited activity within patient's tolerance;Repositioned    Home Living                      Prior Function            PT Goals (current goals can now be found in the care plan section) Progress towards PT goals: Progressing toward goals    Frequency    Min 3X/week      PT Plan Current plan remains appropriate;Frequency needs to be updated     Co-evaluation              AM-PAC PT "6 Clicks" Mobility   Outcome Measure  Help needed turning from your back to your side while in a flat bed without using bedrails?: None Help needed moving from lying on your back to sitting on the side of a flat bed without using bedrails?: None Help needed moving to and from a bed to a chair (including a wheelchair)?: None Help needed standing up from a chair using your arms (e.g., wheelchair or bedside chair)?: None Help needed to walk in hospital room?: A Little Help needed climbing 3-5 steps with a railing? : A Little 6 Click Score: 22    End of Session Equipment Utilized During Treatment: Gait belt Activity Tolerance: Patient tolerated treatment well Patient left: in chair;with call bell/phone within reach Nurse Communication: Mobility status PT Visit Diagnosis: Other abnormalities of gait and mobility (R26.89)     Time: 2482-5003 PT Time Calculation (min) (ACUTE ONLY): 28 min  Charges:  $Gait Training: 8-22 mins $Therapeutic Activity: 8-22 mins                     Warnell Rasnic P, PT Acute Rehabilitation Services Pager: [phone] Office: [phone]    Niquan Charnley B Chisum Habenicht 06/30/2019, 2:07 PM

## 2019-06-30 NOTE — Evaluation (Signed)
Speech Language Pathology Evaluation Patient Details Name: Carly Hicks MRN: [id number] DOB: 1971/03/02 Today's Date: 06/30/2019 Time: 1410-1430 SLP Time Calculation (min) (ACUTE ONLY): 20 min  Problem List:  Patient Active Problem List   Diagnosis Date Noted  . Subarachnoid hemorrhage ([place]) 06/28/2019   Past Medical History:  Past Medical History:  Diagnosis Date  . ASCUS (atypical squamous cells of undetermined significance) on Pap smear   . Gestational diabetes   . H/O sickle cell trait   . History of anal fissures   . HSV-2 infection   . Trichimoniasis 1991  . Yeast infection    h/o   Past Surgical History:  Past Surgical History:  Procedure Laterality Date  . COLPOSCOPY    . IR ANGIO INTRA EXTRACRAN SEL INTERNAL CAROTID BILAT MOD SED  06/29/2019  . IR ANGIO VERTEBRAL SEL VERTEBRAL BILAT MOD SED  06/29/2019  . LEEP  2000  . RADIOLOGY WITH ANESTHESIA N/A 06/29/2019   Procedure: IR WITH ANESTHESIA;  Surgeon: Consuella Lose, MD;  Location: [facility];  Service: Radiology;  Laterality: N/A;  . WISDOM TOOTH EXTRACTION     HPI:  49 yo female admitted to [facility] with severe headache - found to have [place] - bilateral cisterns, sylvian fissures and s/p cerebral angiogram without evidence of IC aneurysm, vasospam or fistula per MD notes.  She is for repeat angiogram during hospital coarse.   Assessment / Plan / Recommendation Clinical Impression  MOCA 7.3 administered to pt with her scoring 26/30 which is [facility].  She demonstrates fluent language, speech and cognition. Minor difficulty with mental math *attention* caused her to miss answer by one digit.  In addition she was able to recall 3/5 independently and 2/5 with category cue.  No focal CN deficits apparent.  No SLP follow up indicated, pt admits she has not slept well for days.  Thanks for this referral.    SLP Assessment  SLP Recommendation/Assessment: Patient does not need any further Speech Lanaguage Pathology Services SLP  Visit Diagnosis: Cognitive communication deficit (R41.841)    Follow Up Recommendations  None    Frequency and Duration           SLP Evaluation Cognition  Overall Cognitive Status: Within Functional Limits for tasks assessed Orientation Level: Oriented X4 Attention: Selective Awareness: Appears intact Problem Solving: Impaired Problem Solving Impairment: Verbal complex(mental math error) Executive Function: (wfl) Safety/Judgment: Appears intact       Comprehension  Auditory Comprehension Overall Auditory Comprehension: Appears within functional limits for tasks assessed Yes/No Questions: Not tested Commands: Within Functional Limits Conversation: Complex Visual Recognition/Discrimination Discrimination: Within Function Limits Reading Comprehension Reading Status: Within funtional limits(reading signs in room, did not formally evaluate)    Expression Expression Primary Mode of Expression: Verbal Verbal Expression Overall Verbal Expression: Appears within functional limits for tasks assessed Initiation: No impairment Level of Generative/Spontaneous Verbalization: Conversation Repetition: No impairment Naming: No impairment Pragmatics: No impairment Written Expression Dominant Hand: Right Written Expression: Within Functional Limits   Oral / Motor  Oral Motor/Sensory Function Overall Oral Motor/Sensory Function: Within functional limits Motor Speech Overall Motor Speech: Appears within functional limits for tasks assessed Respiration: Within functional limits Resonance: Within functional limits Articulation: Within functional limitis Motor Planning: Witnin functional limits   GO                    Macario Golds 06/30/2019, 2:54 PM  Luanna Salk, [place] [facility] SLP Acute Rehab Services Pager [phone] Office [phone]

## 2019-07-01 LAB — GLUCOSE, CAPILLARY
Glucose-Capillary: 207 mg/dL — ABNORMAL HIGH (ref 70–99)
Glucose-Capillary: 267 mg/dL — ABNORMAL HIGH (ref 70–99)
Glucose-Capillary: 182 mg/dL — ABNORMAL HIGH (ref 70–99)
Glucose-Capillary: 249 mg/dL — ABNORMAL HIGH (ref 70–99)

## 2019-07-01 LAB — TRIGLYCERIDES: Triglycerides: 121 mg/dL (ref <150)

## 2019-07-01 MED ORDER — INSULIN ASPART 100 UNIT/ML ~~LOC~~ SOLN: 0.0000 [IU] | Freq: Every day | SUBCUTANEOUS | Status: DC

## 2019-07-01 MED ORDER — INSULIN ASPART 100 UNIT/ML ~~LOC~~ SOLN: 3.0000 [IU] | Freq: Three times a day (TID) | SUBCUTANEOUS | Status: DC

## 2019-07-01 MED ORDER — INSULIN ASPART 100 UNIT/ML ~~LOC~~ SOLN
0.0000 [IU] | Freq: Every day | SUBCUTANEOUS | Status: DC
  Administered 2019-07-01: 3 [IU] via SUBCUTANEOUS

## 2019-07-01 MED ORDER — INSULIN ASPART 100 UNIT/ML ~~LOC~~ SOLN
0.0000 [IU] | Freq: Three times a day (TID) | SUBCUTANEOUS | Status: DC
  Administered 2019-07-01: 7 [IU] via SUBCUTANEOUS

## 2019-07-01 MED ORDER — INSULIN ASPART 100 UNIT/ML ~~LOC~~ SOLN: 4.0000 [IU] | Freq: Three times a day (TID) | SUBCUTANEOUS | Status: DC

## 2019-07-01 MED ORDER — LEVETIRACETAM 500 MG PO TABS
500.0000 mg | ORAL_TABLET | Freq: Two times a day (BID) | ORAL | Status: DC
  Administered 2019-07-01 – 2019-07-08 (×15): 500 mg via ORAL
  Filled 2019-07-01 (×15): qty 1

## 2019-07-01 MED ORDER — INSULIN ASPART 100 UNIT/ML ~~LOC~~ SOLN
0.0000 [IU] | Freq: Three times a day (TID) | SUBCUTANEOUS | Status: DC
  Administered 2019-07-01: 4 [IU] via SUBCUTANEOUS
  Administered 2019-07-01: 11 [IU] via SUBCUTANEOUS
  Administered 2019-07-02: 7 [IU] via SUBCUTANEOUS
  Administered 2019-07-02: 4 [IU] via SUBCUTANEOUS

## 2019-07-01 MED ORDER — INSULIN ASPART 100 UNIT/ML ~~LOC~~ SOLN: 0.0000 [IU] | Freq: Three times a day (TID) | SUBCUTANEOUS | Status: DC

## 2019-07-01 NOTE — Progress Notes (Signed)
States she is doing fairly well.  Denies significant headache.  No change in neurologic exam.  No focal motor or sensory change.  Continue to observe in ICU while she awaits further work-up with follow-up angiogram

## 2019-07-01 NOTE — Plan of Care (Signed)
  Problem: Spontaneous Subarachnoid Hemorrhage Tissue Perfusion: Goal: Complications of Spontaneous Subarachnoid Hemorrhage will be minimized Outcome: Progressing   

## 2019-07-01 NOTE — Plan of Care (Signed)
  Problem: Education: Goal: Knowledge of secondary prevention will improve Outcome: Progressing   

## 2019-07-01 NOTE — Plan of Care (Signed)
Pt understands some of her individual, non-modifiable risk factors for [facility]

## 2019-07-02 ENCOUNTER — Inpatient Hospital Stay (HOSPITAL_COMMUNITY): Payer: Managed Care, Other (non HMO)

## 2019-07-02 DIAGNOSIS — I609 Nontraumatic subarachnoid hemorrhage, unspecified: Secondary | ICD-10-CM

## 2019-07-02 LAB — GLUCOSE, CAPILLARY
Glucose-Capillary: 227 mg/dL — ABNORMAL HIGH (ref 70–99)
Glucose-Capillary: 173 mg/dL — ABNORMAL HIGH (ref 70–99)
Glucose-Capillary: 255 mg/dL — ABNORMAL HIGH (ref 70–99)
Glucose-Capillary: 217 mg/dL — ABNORMAL HIGH (ref 70–99)
Glucose-Capillary: 125 mg/dL — ABNORMAL HIGH (ref 70–99)

## 2019-07-02 MED ORDER — MAGNESIUM CITRATE PO SOLN: 1.0000 | Freq: Once | ORAL | Status: DC

## 2019-07-02 MED ORDER — INSULIN ASPART 100 UNIT/ML ~~LOC~~ SOLN
0.0000 [IU] | SUBCUTANEOUS | Status: DC
  Administered 2019-07-02: 11 [IU] via SUBCUTANEOUS

## 2019-07-02 MED ORDER — INSULIN ASPART 100 UNIT/ML ~~LOC~~ SOLN
0.0000 [IU] | SUBCUTANEOUS | Status: DC
  Administered 2019-07-02 – 2019-07-03 (×2): 7 [IU] via SUBCUTANEOUS
  Administered 2019-07-03: 3 [IU] via SUBCUTANEOUS
  Administered 2019-07-03: 7 [IU] via SUBCUTANEOUS
  Administered 2019-07-03 (×2): 3 [IU] via SUBCUTANEOUS
  Administered 2019-07-03: 7 [IU] via SUBCUTANEOUS
  Administered 2019-07-04: 4 [IU] via SUBCUTANEOUS
  Administered 2019-07-04: 11 [IU] via SUBCUTANEOUS
  Administered 2019-07-04 (×2): 4 [IU] via SUBCUTANEOUS
  Administered 2019-07-04: 7 [IU] via SUBCUTANEOUS
  Administered 2019-07-04 – 2019-07-05 (×2): 3 [IU] via SUBCUTANEOUS
  Administered 2019-07-05 (×3): 4 [IU] via SUBCUTANEOUS
  Administered 2019-07-05: 3 [IU] via SUBCUTANEOUS
  Administered 2019-07-05: 11 [IU] via SUBCUTANEOUS
  Administered 2019-07-06: 4 [IU] via SUBCUTANEOUS
  Administered 2019-07-06: 3 [IU] via SUBCUTANEOUS
  Administered 2019-07-06: 4 [IU] via SUBCUTANEOUS
  Administered 2019-07-06: 3 [IU] via SUBCUTANEOUS
  Administered 2019-07-06 – 2019-07-07 (×2): 11 [IU] via SUBCUTANEOUS
  Administered 2019-07-07 (×2): 4 [IU] via SUBCUTANEOUS
  Administered 2019-07-07 (×3): 3 [IU] via SUBCUTANEOUS
  Administered 2019-07-08 (×2): 4 [IU] via SUBCUTANEOUS

## 2019-07-02 MED ORDER — INSULIN ASPART 100 UNIT/ML ~~LOC~~ SOLN: 0.0000 [IU] | SUBCUTANEOUS | Status: DC

## 2019-07-02 MED ORDER — POLYETHYLENE GLYCOL 3350 17 G PO PACK
17.0000 g | PACK | Freq: Every day | ORAL | Status: DC
  Administered 2019-07-02 – 2019-07-05 (×4): 17 g via ORAL
  Filled 2019-07-02 (×6): qty 1

## 2019-07-02 NOTE — Progress Notes (Addendum)
Occupational Therapy Treatment Patient Details Name: Carly Hicks MRN: [id number] DOB: 1971/02/05 Today's Date: 07/02/2019    History of present illness Carly Hicks is a 48 y.o. female admitted with severe headache, stat CTA and was found to have a large subarachnoid hemorrhage involving the basilar cisterns and sylvian fissures.  Admitted to ICU for close monitoring and felt to have ventriculomegaly without signs of hydrocephalus.  Planned for cerebral angiogram later today.   OT comments  Pt progressing well toward stated goals. Able to complete grooming and toileting tasks with supervision level of assist. BSC recommended due to pt having to complete multistep ADL in sitting to preserve energy and maintain safety. She continues to endorse mild HA and dizziness with sudden positional movements. Pt able to complete functional mobility in hallway beyond household distance without LOB noted. BP as follows: supine - 134/76; 126/75 standing; 129/84 end of session. D/c recs updated to no OT f/u. Will continue to follow acutely.    Follow Up Recommendations  No OT follow up    Equipment Recommendations  3 in 1 bedside commode    Recommendations for Other Services      Precautions / Restrictions Precautions Precautions: Other (comment) Precaution Comments: SBP <140 Restrictions Weight Bearing Restrictions: No       Mobility Bed Mobility Overal bed mobility: Modified Independent                Transfers Overall transfer level: Modified independent               General transfer comment: pt only needing assist for line management    Balance Overall balance assessment: Mild deficits observed, not formally tested                                         ADL either performed or assessed with clinical judgement   ADL Overall ADL's : Needs assistance/impaired     Grooming: Modified independent;Sitting;Oral care;Wash/dry face;Wash/dry  hands Grooming Details (indicate cue type and reason): completed in sitting on BSC, but able to gather own supplies and set up tasks for completion. States she is sitting this date due to minor dizziness and generalized fatiuge             Lower Body Dressing: Modified independent;Sit to/from stand Lower Body Dressing Details (indicate cue type and reason): donned sweatpants while seated on [facility] Toilet Transfer: Supervision/safety;Regular Toilet;Grab bars Toilet Transfer Details (indicate cue type and reason): supervision for safety, reports feeling unsteady but able to complete without physical assist Toileting- Clothing Manipulation and Hygiene: Supervision/safety;Sit to/from stand;Sitting/lateral lean       Functional mobility during ADLs: Min guard General ADL Comments: able to tolerate increased BADL progression this date, SBP all <140     Vision   Vision Assessment?: No apparent visual deficits   Perception     Praxis      Cognition Arousal/Alertness: Awake/alert Behavior During Therapy: WFL for tasks assessed/performed Overall Cognitive Status: Within Functional Limits for tasks assessed                                          Exercises     Shoulder Instructions       General Comments      Pertinent Vitals/ Pain  Pain Assessment: Faces Faces Pain Scale: Hurts a little bit Pain Location: head, hips Pain Descriptors / Indicators: Aching Pain Intervention(s): Limited activity within patient's tolerance;Repositioned;Monitored during session;Other (comment)(pt stretched hips while supine on bed)  Home Living                                          Prior Functioning/Environment              Frequency  Min 2X/week        Progress Toward Goals  OT Goals(current goals can now be found in the care plan section)  Progress towards OT goals: Progressing toward goals  Acute Rehab OT Goals Patient Stated Goal: to  return to independent OT Goal Formulation: With patient Time For Goal Achievement: 07/13/19 Potential to Achieve Goals: Good  Plan      Co-evaluation                 AM-PAC OT "6 Clicks" Daily Activity     Outcome Measure   Help from another person eating meals?: None Help from another person taking care of personal grooming?: A Little Help from another person toileting, which includes using toliet, bedpan, or urinal?: A Little Help from another person bathing (including washing, rinsing, drying)?: A Little Help from another person to put on and taking off regular upper body clothing?: None Help from another person to put on and taking off regular lower body clothing?: A Little 6 Click Score: 20    End of Session Equipment Utilized During Treatment: Gait belt;Rolling walker  OT Visit Diagnosis: Other symptoms and signs involving the nervous system (R29.898);Unsteadiness on feet (R26.81)   Activity Tolerance Patient tolerated treatment well   Patient Left in chair;with call bell/phone within reach   Nurse Communication Mobility status        Time: 2831-5176 OT Time Calculation (min): 31 min  Charges: OT General Charges $OT Visit: 1 Visit OT Treatments $Self Care/Home Management : 23-37 mins  Dalphine Handing, MSOT, OTR/L Behavioral Health OT/ Acute Relief OT [facility] Office: [phone]  Dalphine Handing 07/02/2019, 1:57 PM

## 2019-07-02 NOTE — Progress Notes (Signed)
11/2Transcranial Doppler  Date POD PCO2 HCT BP  MCA ACA PCA OPHT SIPH VERT Basilar  10/30     Right  Left   51  61   -19  -34   27  13   16  20    41  41   -12  -12   23      11/2,RS     Right  Left   72  74   -61  -62   20  35   19  21   40  70   -34  -28   -43           Right  Left                                             Right  Left                                             Right  Left                                            Right  Left                                            Right  Left                                        MCA = Middle Cerebral Artery      OPHT = Opthalmic Artery     BASILAR = Basilar Artery   ACA = Anterior Cerebral Artery     SIPH = Carotid Siphon PCA = Posterior Cerebral Artery   VERT = Verterbral Artery                   Normal MCA = 62+\-12 ACA = 50+\-12 PCA = 42+\-23

## 2019-07-02 NOTE — Anesthesia Postprocedure Evaluation (Signed)
Anesthesia Post Note  Patient: Carly Hicks  Procedure(s) Performed: IR WITH ANESTHESIA (N/A )     Patient location during evaluation: PACU Anesthesia Type: MAC Level of consciousness: awake and alert Pain management: pain level controlled Vital Signs Assessment: post-procedure vital signs reviewed and stable Respiratory status: spontaneous breathing, nonlabored ventilation, respiratory function stable and patient connected to nasal cannula oxygen Cardiovascular status: stable and blood pressure returned to baseline Postop Assessment: no apparent nausea or vomiting Anesthetic complications: no    Last Vitals:  Vitals:   07/02/19 1600 07/02/19 1800  BP:  129/76  Pulse:  84  Resp:  16  Temp: 37.3 C   SpO2:  100%    Last Pain:  Vitals:   07/02/19 1600  TempSrc: Oral  PainSc:                  [name]

## 2019-07-02 NOTE — Progress Notes (Signed)
  NEUROSURGERY PROGRESS NOTE   No issues overnight.  Complains of HA, fluctuating in intensity No changes in vision, N/T, weakness  EXAM:  BP 131/85   Pulse 83   Temp 98.6 F (37 C) (Axillary)   Resp 16   Ht 5' 3.5" (1.613 m)   Wt 75.3 kg   LMP 06/24/2019   SpO2 99%   BMI 28.95 kg/m   Awake, alert, oriented  Speech fluent, appropriate  CN grossly intact  5/5 BUE/BLE   IMPRESSION/PLAN 48 y.o. female angio negative SAH d#4. Doing well. - continue supportive care, nimotop - TCDs - up ad lib - Plan for repeat angio later this week  Newly diagnosed DM - A1C 9.9% - Increase cbg testing - SSI  - consult DM coordinator  Ferne Reus, PA-C [facility]

## 2019-07-03 DIAGNOSIS — E1165 Type 2 diabetes mellitus with hyperglycemia: Secondary | ICD-10-CM

## 2019-07-03 LAB — GLUCOSE, CAPILLARY
Glucose-Capillary: 131 mg/dL — ABNORMAL HIGH (ref 70–99)
Glucose-Capillary: 201 mg/dL — ABNORMAL HIGH (ref 70–99)
Glucose-Capillary: 218 mg/dL — ABNORMAL HIGH (ref 70–99)
Glucose-Capillary: 135 mg/dL — ABNORMAL HIGH (ref 70–99)
Glucose-Capillary: 225 mg/dL — ABNORMAL HIGH (ref 70–99)

## 2019-07-03 MED ORDER — LIVING WELL WITH DIABETES BOOK
Freq: Once | Status: AC
  Administered 2019-07-06: 08:00:00
  Filled 2019-07-03: qty 1

## 2019-07-03 NOTE — Progress Notes (Signed)
  NEUROSURGERY PROGRESS NOTE   No issues overnight.  HA continues as expected, fluctuating in severity Denies changes in vision, N/T, weakness  EXAM:  BP 113/71   Pulse 79   Temp 98.3 F (36.8 C) (Axillary)   Resp 16   Ht 5' 3.5" (1.613 m)   Wt 75.3 kg   LMP 06/24/2019   SpO2 98%   BMI 28.95 kg/m   Awake, alert, oriented  Speech fluent, appropriate  CN grossly intact  5/5 BUE/BLE  No drift  Date POD PCO2 HCT BP  MCA ACA PCA OPHT SIPH VERT Basilar  10/30     Right  Left   51  61   -19  -34   27  13   16  20    41  41   -12  -12   23      11/2,RS     Right  Left   72  74   -61  -62   20  35   19  21   40  70   -34  -28   -57        IMPRESSION/PLAN 48 y.o. female angio negative SAH d#5. Stable neurologically - Continue supportive care, nimotop - TCDs  - up ad lib - repeat angio end of this week  Diabetes - reports previously controlled with diet and exercise - A1C 9.9% - Increase cbg testing - SSI  - consulted DM coordinator  Ferne Reus, PA-C [facility]

## 2019-07-03 NOTE — Progress Notes (Signed)
Inpatient Diabetes Program Recommendations  AACE/ADA: New Consensus Statement on Inpatient Glycemic Control (2015)  Target Ranges:  Prepandial:   less than 140 mg/dL      Peak postprandial:   less than 180 mg/dL (1-2 hours)      Critically ill patients:  140 - 180 mg/dL   Lab Results  Component Value Date   GLUCAP 135 (H) 07/03/2019   HGBA1C 9.9 (H) 06/29/2019    Review of Glycemic Control  Diabetes history: GDM, DM2 (reports previously controlled with diet/exercise) Outpatient Diabetes medications: None Current orders for Inpatient glycemic control: Novolog 0-20 units tidwc and 0-5 units QHS  HgbA1C - 9.9% Ordered Living Well with Diabetes book  Inpatient Diabetes Program Recommendations:     Add Lantus 10 units QHS  Will speak with pt regarding lifestyle modifications of weight loss, exercise, importance of monitoring blood sugars and taking meds/insulin to control diabetes.  Will continue to follow closely.  Thank you. Lorenda Peck, RD, LDN, CDE Inpatient Diabetes Coordinator [phone]

## 2019-07-03 NOTE — Progress Notes (Signed)
Physical Therapy Treatment Patient Details Name: Carly Hicks MRN: [id number] DOB: 07/21/1971 Today's Date: 07/03/2019    History of Present Illness Keimani Laufer is a 48 y.o. female admitted with severe headache, stat CTA and was found to have a large subarachnoid hemorrhage involving the basilar cisterns and sylvian fissures.  Admitted to ICU for close monitoring and felt to have ventriculomegaly without signs of hydrocephalus.  Planned for cerebral angiogram later today.    PT Comments    Pt pleasant and reports neck pain today with head movement with pt maintaining decreased ROM to control pain. Pt reports generalized fatigue and decreased activity tolerance with encouragement to increase mobility throughout the day and ambulate daily. Pt continues to demonstrate higher level mild balance deficits with education for awareness, safety and decreased gait speed for gait to decrease fall risk.     Follow Up Recommendations  No PT follow up     Equipment Recommendations  None recommended by PT    Recommendations for Other Services       Precautions / Restrictions Precautions Precaution Comments: SBP <140 Restrictions Weight Bearing Restrictions: No    Mobility  Bed Mobility Overal bed mobility: Modified Independent                Transfers Overall transfer level: Modified independent               General transfer comment: pt able to stand from bed and toilet without assist  Ambulation/Gait Ambulation/Gait assistance: Supervision Gait Distance (Feet): 600 Feet Assistive device: None Gait Pattern/deviations: Step-through pattern;Decreased stride length   Gait velocity interpretation: >2.62 ft/sec, indicative of community ambulatory General Gait Details: pt with limited head turns during gait due to pain with 2 mild LOB with pt able to recover without assist   Stairs             Wheelchair Mobility    Modified Rankin (Stroke Patients  Only) Modified Rankin (Stroke Patients Only) Pre-Morbid Rankin Score: No symptoms Modified Rankin: Slight disability     Balance Overall balance assessment: Mild deficits observed, not formally tested                                          Cognition Arousal/Alertness: Awake/alert Behavior During Therapy: WFL for tasks assessed/performed Overall Cognitive Status: Within Functional Limits for tasks assessed                                        Exercises      General Comments        Pertinent Vitals/Pain Pain Score: 4  Pain Location: neck and head Pain Descriptors / Indicators: Aching Pain Intervention(s): Limited activity within patient's tolerance;Repositioned;Premedicated before session;Monitored during session    Home Living                      Prior Function            PT Goals (current goals can now be found in the care plan section) Progress towards PT goals: Progressing toward goals    Frequency    Min 3X/week      PT Plan Current plan remains appropriate    Co-evaluation              AM-PAC PT "6  Clicks" Mobility   Outcome Measure  Help needed turning from your back to your side while in a flat bed without using bedrails?: None Help needed moving from lying on your back to sitting on the side of a flat bed without using bedrails?: None Help needed moving to and from a bed to a chair (including a wheelchair)?: None Help needed standing up from a chair using your arms (e.g., wheelchair or bedside chair)?: None Help needed to walk in hospital room?: A Little Help needed climbing 3-5 steps with a railing? : A Little 6 Click Score: 22    End of Session   Activity Tolerance: Patient tolerated treatment well Patient left: in chair;with call bell/phone within reach Nurse Communication: Mobility status PT Visit Diagnosis: Other abnormalities of gait and mobility (R26.89)     Time: 3785-8850 PT  Time Calculation (min) (ACUTE ONLY): 22 min  Charges:  $Gait Training: 8-22 mins                     Jaydah Stahle P, PT Acute Rehabilitation Services Pager: [phone] Office: [phone]    Clancy Leiner B Emmani Lesueur 07/03/2019, 9:55 AM

## 2019-07-04 ENCOUNTER — Inpatient Hospital Stay (HOSPITAL_COMMUNITY): Payer: Managed Care, Other (non HMO)

## 2019-07-04 DIAGNOSIS — I609 Nontraumatic subarachnoid hemorrhage, unspecified: Secondary | ICD-10-CM

## 2019-07-04 LAB — GLUCOSE, CAPILLARY
Glucose-Capillary: 149 mg/dL — ABNORMAL HIGH (ref 70–99)
Glucose-Capillary: 157 mg/dL — ABNORMAL HIGH (ref 70–99)
Glucose-Capillary: 192 mg/dL — ABNORMAL HIGH (ref 70–99)
Glucose-Capillary: 199 mg/dL — ABNORMAL HIGH (ref 70–99)
Glucose-Capillary: 200 mg/dL — ABNORMAL HIGH (ref 70–99)
Glucose-Capillary: 202 mg/dL — ABNORMAL HIGH (ref 70–99)
Glucose-Capillary: 290 mg/dL — ABNORMAL HIGH (ref 70–99)

## 2019-07-04 MED ORDER — INSULIN GLARGINE 100 UNIT/ML ~~LOC~~ SOLN
10.0000 [IU] | Freq: Every day | SUBCUTANEOUS | Status: DC
  Administered 2019-07-04 – 2019-07-07 (×4): 10 [IU] via SUBCUTANEOUS
  Filled 2019-07-04 (×5): qty 0.1

## 2019-07-04 NOTE — Progress Notes (Signed)
11/2Transcranial Doppler  Date POD PCO2 HCT BP  MCA ACA PCA OPHT SIPH VERT Basilar  10/30 MR     Right  Left   51  61   -19  -34   27  13   16  20    41  41   -12  -12   23      11/2,RS     Right  Left   72  74   -61  -62   20  35   19  21   40  70   -34  -28   -43      11/4 MR     Right  Left   63  67   -35  -39   31  21   11  14    34  34   -21  *   -42            Right  Left                                             Right  Left                                            Right  Left                                            Right  Left                                        MCA = Middle Cerebral Artery      OPHT = Opthalmic Artery     BASILAR = Basilar Artery   ACA = Anterior Cerebral Artery     SIPH = Carotid Siphon PCA = Posterior Cerebral Artery   VERT = Verterbral Artery                   Normal MCA = 62+\-12 ACA = 50+\-12 PCA = 42+\-23

## 2019-07-04 NOTE — Progress Notes (Signed)
  NEUROSURGERY PROGRESS NOTE   No issues overnight.  No concerns this am Ambulated yesterday multiple times Denies N/T, weakness  EXAM:  BP 128/76   Pulse 81   Temp 98.4 F (36.9 C) (Oral)   Resp 16   Ht 5' 3.5" (1.613 m)   Wt 75.3 kg   LMP 06/24/2019   SpO2 99%   BMI 28.95 kg/m   Awake, alert, oriented  Speech fluent, appropriate  CN grossly intact  5/5 BUE/BLE   IMPRESSION/PLAN 48 y.o. female [place] d#6. Doing well. - Continue supportive care, nimotop - TCDs  - up ad lib - repeat angio Friday  Diabetes - reports previously controlled with diet and exercise - A1C 9.9% - Increase cbg testing - SSI  - appreciate DM coordinator recs  Ferne Reus, PA-C [facility]

## 2019-07-05 ENCOUNTER — Other Ambulatory Visit (HOSPITAL_COMMUNITY): Payer: Managed Care, Other (non HMO)

## 2019-07-05 LAB — GLUCOSE, CAPILLARY
Glucose-Capillary: 149 mg/dL — ABNORMAL HIGH (ref 70–99)
Glucose-Capillary: 110 mg/dL — ABNORMAL HIGH (ref 70–99)
Glucose-Capillary: 192 mg/dL — ABNORMAL HIGH (ref 70–99)
Glucose-Capillary: 281 mg/dL — ABNORMAL HIGH (ref 70–99)
Glucose-Capillary: 198 mg/dL — ABNORMAL HIGH (ref 70–99)
Glucose-Capillary: 125 mg/dL — ABNORMAL HIGH (ref 70–99)

## 2019-07-05 NOTE — Progress Notes (Signed)
Physical Therapy Treatment Patient Details Name: Carly Hicks MRN: [id number] DOB: [birth year] Today's Date: 07/05/2019    History of Present Illness Carly Hicks is a 48 y.o. female admitted with severe headache, stat CTA and was found to have a large subarachnoid hemorrhage involving the basilar cisterns and sylvian fissures.  Admitted to ICU for close monitoring and felt to have ventriculomegaly without signs of hydrocephalus.  Planned for cerebral angiogram later today.    PT Comments    Pt supine on arrival reporting left sciatica pain returning last night which is a struggle at baseline for pt. Pt educated for back precautions for mobility and performed seated piriformis stretch EOB with some relief. Pt with decreased gait and activity tolerance due to sciatica and educated for positioning, stretching and continued gait.     Follow Up Recommendations  No PT follow up     Equipment Recommendations  None recommended by PT    Recommendations for Other Services       Precautions / Restrictions Precautions Precaution Comments: SBP <140 Restrictions Weight Bearing Restrictions: No    Mobility  Bed Mobility Overal bed mobility: Needs Assistance Bed Mobility: Sidelying to Sit;Sit to Sidelying;Rolling Rolling: Supervision Sidelying to sit: Supervision     Sit to sidelying: Supervision General bed mobility comments: cues for sequence to maintain back precautions to assist with pt sciatica pain experienced today  Transfers Overall transfer level: Modified independent               General transfer comment: pt able to stand from bed and toilet  Ambulation/Gait Ambulation/Gait assistance: Supervision Gait Distance (Feet): 300 Feet Assistive device: None Gait Pattern/deviations: Step-through pattern;Decreased stride length;Wide base of support   Gait velocity interpretation: 1.31 - 2.62 ft/sec, indicative of limited community ambulator General Gait Details: pt  with increased BOS, decreased speed and increased caution with movement due to LLE sciatica pain since last night   Stairs             Wheelchair Mobility    Modified Rankin (Stroke Patients Only)       Balance Overall balance assessment: Mild deficits observed, not formally tested                                          Cognition Arousal/Alertness: Awake/alert Behavior During Therapy: WFL for tasks assessed/performed Overall Cognitive Status: Within Functional Limits for tasks assessed                                        Exercises      General Comments        Pertinent Vitals/Pain Pain Score: 3  Pain Location: HA Pain Descriptors / Indicators: Aching Pain Intervention(s): Limited activity within patient's tolerance;Monitored during session;Repositioned    Home Living                      Prior Function            PT Goals (current goals can now be found in the care plan section) Progress towards PT goals: Progressing toward goals    Frequency           PT Plan Current plan remains appropriate    Co-evaluation  AM-PAC PT "6 Clicks" Mobility   Outcome Measure  Help needed turning from your back to your side while in a flat bed without using bedrails?: A Little Help needed moving from lying on your back to sitting on the side of a flat bed without using bedrails?: A Little Help needed moving to and from a bed to a chair (including a wheelchair)?: None Help needed standing up from a chair using your arms (e.g., wheelchair or bedside chair)?: None Help needed to walk in hospital room?: A Little Help needed climbing 3-5 steps with a railing? : A Little 6 Click Score: 20    End of Session   Activity Tolerance: Patient tolerated treatment well Patient left: in bed;with call bell/phone within reach Nurse Communication: Mobility status PT Visit Diagnosis: Other abnormalities of gait  and mobility (R26.89)     Time: 6712-4580 PT Time Calculation (min) (ACUTE ONLY): 27 min  Charges:  $Gait Training: 8-22 mins $Therapeutic Activity: 8-22 mins                     Tyrene Nader P, PT Acute Rehabilitation Services Pager: [phone] Office: [phone]    Arnol Mcgibbon B Maryclare Nydam 07/05/2019, 11:08 AM

## 2019-07-05 NOTE — Progress Notes (Signed)
  NEUROSURGERY PROGRESS NOTE   No issues overnight.  Concerned about lack of BM. Mother to bring "green shake" for her  No other concerns  EXAM:  BP 133/80   Pulse 85   Temp 98.2 F (36.8 C) (Oral)   Resp 11   Ht 5' 3.5" (1.613 m)   Wt 75.3 kg   LMP 06/24/2019   SpO2 97%   BMI 28.95 kg/m   Awake, alert, oriented  Speech fluent, appropriate  CN grossly intact  5/5 BUE/BLE   IMPRESSION/PLAN 48 y.o. female angio negative SAH d#7.Doing well. - Continue supportive care, nimotop - TCDs  - up ad lib - repeat angio tomorrow, NPO at midnight  Diabetes - reports previously controlled with diet and exercise - A1C 9.9% - Increase cbg testing - SSI  - appreciate DM coordinator recs  Ferne Reus, PA-C [facility]

## 2019-07-06 ENCOUNTER — Inpatient Hospital Stay (HOSPITAL_COMMUNITY): Payer: Managed Care, Other (non HMO)

## 2019-07-06 ENCOUNTER — Encounter (HOSPITAL_COMMUNITY): Payer: Self-pay | Admitting: Neurosurgery

## 2019-07-06 DIAGNOSIS — I609 Nontraumatic subarachnoid hemorrhage, unspecified: Principal | ICD-10-CM

## 2019-07-06 HISTORY — PX: IR ANGIO VERTEBRAL SEL VERTEBRAL BILAT MOD SED: IMG5369

## 2019-07-06 HISTORY — PX: IR US GUIDE VASC ACCESS RIGHT: IMG2390

## 2019-07-06 HISTORY — PX: IR ANGIO INTRA EXTRACRAN SEL INTERNAL CAROTID BILAT MOD SED: IMG5363

## 2019-07-06 LAB — GLUCOSE, CAPILLARY
Glucose-Capillary: 180 mg/dL — ABNORMAL HIGH (ref 70–99)
Glucose-Capillary: 138 mg/dL — ABNORMAL HIGH (ref 70–99)
Glucose-Capillary: 152 mg/dL — ABNORMAL HIGH (ref 70–99)
Glucose-Capillary: 125 mg/dL — ABNORMAL HIGH (ref 70–99)
Glucose-Capillary: 266 mg/dL — ABNORMAL HIGH (ref 70–99)

## 2019-07-06 MED ORDER — VERAPAMIL HCL 2.5 MG/ML IV SOLN
INTRAVENOUS | Status: AC | PRN
  Administered 2019-07-06: 2.5 mg via INTRA_ARTERIAL

## 2019-07-06 MED ORDER — FENTANYL CITRATE (PF) 100 MCG/2ML IJ SOLN
INTRAMUSCULAR | Status: AC
  Filled 2019-07-06: qty 2

## 2019-07-06 MED ORDER — HEPARIN SODIUM (PORCINE) 1000 UNIT/ML IJ SOLN
INTRAMUSCULAR | Status: AC | PRN
  Administered 2019-07-06: 3000 [IU] via INTRAVENOUS

## 2019-07-06 MED ORDER — NITROGLYCERIN 1 MG/10 ML FOR IR/CATH LAB
INTRA_ARTERIAL | Status: AC | PRN
  Administered 2019-07-06: 300 ug via INTRA_ARTERIAL

## 2019-07-06 MED ORDER — MIDAZOLAM HCL 2 MG/2ML IJ SOLN
INTRAMUSCULAR | Status: AC
  Filled 2019-07-06: qty 2

## 2019-07-06 MED ORDER — IOHEXOL 300 MG/ML  SOLN
100.0000 mL | Freq: Once | INTRAMUSCULAR | Status: AC | PRN
  Administered 2019-07-06: 50 mL via INTRA_ARTERIAL

## 2019-07-06 MED ORDER — NITROGLYCERIN 1 MG/10 ML FOR IR/CATH LAB
INTRA_ARTERIAL | Status: AC
  Filled 2019-07-06: qty 10

## 2019-07-06 MED ORDER — MIDAZOLAM HCL 2 MG/2ML IJ SOLN
INTRAMUSCULAR | Status: AC | PRN
  Administered 2019-07-06: 1 mg via INTRAVENOUS

## 2019-07-06 MED ORDER — HEPARIN SODIUM (PORCINE) 1000 UNIT/ML IJ SOLN
INTRAMUSCULAR | Status: AC
  Filled 2019-07-06: qty 1

## 2019-07-06 MED ORDER — LIDOCAINE HCL 1 % IJ SOLN
INTRAMUSCULAR | Status: AC | PRN
  Administered 2019-07-06: 8 mL

## 2019-07-06 MED ORDER — LIDOCAINE HCL 1 % IJ SOLN
INTRAMUSCULAR | Status: AC
  Filled 2019-07-06: qty 20

## 2019-07-06 MED ORDER — NITROGLYCERIN 0.4 MG SL SUBL
SUBLINGUAL_TABLET | SUBLINGUAL | Status: AC
  Filled 2019-07-06: qty 1

## 2019-07-06 MED ORDER — FENTANYL CITRATE (PF) 100 MCG/2ML IJ SOLN
INTRAMUSCULAR | Status: AC | PRN
  Administered 2019-07-06: 50 ug via INTRAVENOUS

## 2019-07-06 MED ORDER — VERAPAMIL HCL 2.5 MG/ML IV SOLN
INTRAVENOUS | Status: AC
  Filled 2019-07-06: qty 2

## 2019-07-06 NOTE — Sedation Documentation (Signed)
Right radial sheath removed. TR band applied to right wrist @ 1739 with 13cc of air.

## 2019-07-06 NOTE — Progress Notes (Signed)
PT Cancellation Note  Patient Details Name: Carly Hicks MRN: [id number] DOB: 07/06/1971   Cancelled Treatment:    Reason Eval/Treat Not Completed: Pain limiting ability to participate(pt reports continued sciatica limiting mobility and requests use of TENS prior to therapy)   [name] [name] 07/06/2019, 10:22 AM  Bayard Males, PT Acute Rehabilitation Services Pager: [phone] Office: [phone]

## 2019-07-06 NOTE — Progress Notes (Signed)
Physical Therapy Treatment Patient Details Name: Carly Hicks MRN: [id number] DOB: [birth year] Today's Date: 07/06/2019    History of Present Illness Carly Hicks is a 48 y.o. female admitted with severe headache, stat CTA and was found to have a large subarachnoid hemorrhage involving the basilar cisterns and sylvian fissures.  Admitted to ICU for close monitoring and felt to have ventriculomegaly without signs of hydrocephalus.    PT Comments    On initial arrival pt moving to chair with nursing requesting use of TENs prior to therapy. Upon return pt performing piriformis stretches in bed with assist to position feet initially and pt able to stretch bil Piriformis and performing posterior pelvic tilt prior to mobility with pt reporting decrease sciatica and desire to walk. Pt with slight dizziness and difficulty with turns with improvement with cues for gaze fixation with gait and gaze at eye level not the floor. Pt with improved mobility from yesterday but not back to baseline limited by sciatica with only slight 2/10 HA and dizziness with turns. D/C plan remains appropriate.     Follow Up Recommendations  No PT follow up     Equipment Recommendations  None recommended by PT    Recommendations for Other Services       Precautions / Restrictions Precautions Precaution Comments: SBP <140    Mobility  Bed Mobility Overal bed mobility: Needs Assistance   Rolling: Supervision Sidelying to sit: Modified independent (Device/Increase time)       General bed mobility comments: cues for sequence to roll and rise from side  Transfers Overall transfer level: Needs assistance   Transfers: Sit to/from Stand Sit to Stand: Min guard         General transfer comment: guarding for safety due to recent spasms and difficulty maintaining standing  Ambulation/Gait Ambulation/Gait assistance: Min guard Gait Distance (Feet): 350 Feet Assistive device: None Gait  Pattern/deviations: Step-through pattern;Decreased stride length   Gait velocity interpretation: >2.62 ft/sec, indicative of community ambulatory General Gait Details: pt initially with good speed and narrow BoS with decreased speed after grossly 200' with pt with increasing caution due to feeling sciatica pulling. Last foot of gait pt with spasm with hands on guarding for safety   Stairs             Wheelchair Mobility    Modified Rankin (Stroke Patients Only)       Balance Overall balance assessment: Mild deficits observed, not formally tested Sitting-balance support: Feet supported Sitting balance-Leahy Scale: Normal     Standing balance support: No upper extremity supported Standing balance-Leahy Scale: Good                              Cognition Arousal/Alertness: Awake/alert Behavior During Therapy: WFL for tasks assessed/performed Overall Cognitive Status: Within Functional Limits for tasks assessed                                        Exercises      General Comments        Pertinent Vitals/Pain Pain Score: 5  Pain Location: left hip with spasm, 2/10 HA Pain Descriptors / Indicators: Aching Pain Intervention(s): Limited activity within patient's tolerance;Monitored during session;Repositioned    Home Living                      Prior  Function            PT Goals (current goals can now be found in the care plan section) Progress towards PT goals: Progressing toward goals    Frequency    Min 3X/week      PT Plan Current plan remains appropriate    Co-evaluation              AM-PAC PT "6 Clicks" Mobility   Outcome Measure  Help needed turning from your back to your side while in a flat bed without using bedrails?: A Little Help needed moving from lying on your back to sitting on the side of a flat bed without using bedrails?: A Little Help needed moving to and from a bed to a chair (including  a wheelchair)?: A Little Help needed standing up from a chair using your arms (e.g., wheelchair or bedside chair)?: A Little Help needed to walk in hospital room?: A Little Help needed climbing 3-5 steps with a railing? : A Little 6 Click Score: 18    End of Session   Activity Tolerance: Patient tolerated treatment well Patient left: in chair;with call bell/phone within reach Nurse Communication: Mobility status PT Visit Diagnosis: Other abnormalities of gait and mobility (R26.89)     Time: 7591-6384 PT Time Calculation (min) (ACUTE ONLY): 21 min  Charges:  $Gait Training: 8-22 mins                     Leonia Heatherly P, PT Acute Rehabilitation Services Pager: [phone] Office: [phone]    Shanah Guimaraes B Anuar Walgren 07/06/2019, 1:13 PM

## 2019-07-06 NOTE — Brief Op Note (Signed)
  NEUROSURGERY BRIEF OPERATIVE  NOTE   PREOP DX: Subarachnoid Hemorrhage  POSTOP DX: Same  PROCEDURE: Diagnostic cerebral angiogram  SURGEON: Dr. Consuella Lose, MD  ANESTHESIA: IV Sedation with Local  EBL: Minimal  SPECIMENS: None  COMPLICATIONS: None  CONDITION: Stable to ICU  FINDINGS (Full report in [facility]): 1. No intracranial aneurysms, AVMs, or fistulas seen. No vasospasm.

## 2019-07-06 NOTE — Progress Notes (Signed)
Occupational Therapy Treatment Patient Details Name: Carly Hicks MRN: [id number] DOB: Feb 27, 1971 Today's Date: 07/06/2019    History of present illness Reena Borromeo is a 48 y.o. female admitted with severe headache, stat CTA and was found to have a large subarachnoid hemorrhage involving the basilar cisterns and sylvian fissures.  Admitted to ICU for close monitoring and felt to have ventriculomegaly without signs of hydrocephalus.   OT comments  Pt continues to progress well toward OT goals. Somewhat limited 2/2 L glute spasm, needing min A to steady and min guard for functional mobility. Pt completed toilet transfer with min guard and was able to don pants from seated position. Continues to endorse mild dizziness and HA. Continued to functional mobility in the hall at min guard with PT at end of session. Will continue to follow while acute. D/c recs appropriate.    Follow Up Recommendations  No OT follow up    Equipment Recommendations  3 in 1 bedside commode    Recommendations for Other Services      Precautions / Restrictions Precautions Precautions: Other (comment) Precaution Comments: SBP <140 Restrictions Weight Bearing Restrictions: No       Mobility Bed Mobility Overal bed mobility: Modified Independent   Rolling: Supervision Sidelying to sit: Modified independent (Device/Increase time)       General bed mobility comments: verbalizing log roll technique this date  Transfers Overall transfer level: Needs assistance   Transfers: Sit to/from Stand Sit to Stand: Min guard         General transfer comment: guarding for safety due to recent spasms and difficulty maintaining standing    Balance Overall balance assessment: Mild deficits observed, not formally tested Sitting-balance support: Feet supported Sitting balance-Leahy Scale: Normal     Standing balance support: No upper extremity supported Standing balance-Leahy Scale: Good                              ADL either performed or assessed with clinical judgement   ADL Overall ADL's : Needs assistance/impaired                     Lower Body Dressing: Modified independent;Sit to/from stand Lower Body Dressing Details (indicate cue type and reason): donned sweatpants while seated on BSC               General ADL Comments: pt having spasm in L glute compromising balance to min A     Vision   Vision Assessment?: No apparent visual deficits   Perception     Praxis      Cognition Arousal/Alertness: Awake/alert Behavior During Therapy: WFL for tasks assessed/performed Overall Cognitive Status: Within Functional Limits for tasks assessed                                          Exercises     Shoulder Instructions       General Comments      Pertinent Vitals/ Pain       Pain Assessment: Faces Pain Score: 5  Faces Pain Scale: Hurts little more Pain Location: left hip with spasm, 2/10 HA Pain Descriptors / Indicators: Aching Pain Intervention(s): Limited activity within patient's tolerance;Monitored during session;Repositioned  Home Living  Prior Functioning/Environment              Frequency  Min 2X/week        Progress Toward Goals  OT Goals(current goals can now be found in the care plan section)  Progress towards OT goals: Progressing toward goals  Acute Rehab OT Goals Patient Stated Goal: to return to independent OT Goal Formulation: With patient Time For Goal Achievement: 07/13/19 Potential to Achieve Goals: Good  Plan Discharge plan remains appropriate;Frequency remains appropriate    Co-evaluation                 AM-PAC OT "6 Clicks" Daily Activity     Outcome Measure   Help from another person eating meals?: None Help from another person taking care of personal grooming?: A Little Help from another person toileting, which  includes using toliet, bedpan, or urinal?: A Little Help from another person bathing (including washing, rinsing, drying)?: A Little Help from another person to put on and taking off regular upper body clothing?: None Help from another person to put on and taking off regular lower body clothing?: A Little 6 Click Score: 20    End of Session Equipment Utilized During Treatment: Gait belt  OT Visit Diagnosis: Other symptoms and signs involving the nervous system (R29.898);Unsteadiness on feet (R26.81)   Activity Tolerance Patient tolerated treatment well   Patient Left Other (comment)(in hall with PT)   Nurse Communication Mobility status        Time: 5885-0277 OT Time Calculation (min): 22 min  Charges: OT General Charges $OT Visit: 1 Visit OT Treatments $Self Care/Home Management : 8-22 mins  Dalphine Handing, MSOT, OTR/L Behavioral Health OT/ Acute Relief OT [facility] Office: [phone]  Dalphine Handing 07/06/2019, 1:50 PM

## 2019-07-07 LAB — GLUCOSE, CAPILLARY
Glucose-Capillary: 138 mg/dL — ABNORMAL HIGH (ref 70–99)
Glucose-Capillary: 146 mg/dL — ABNORMAL HIGH (ref 70–99)
Glucose-Capillary: 150 mg/dL — ABNORMAL HIGH (ref 70–99)
Glucose-Capillary: 189 mg/dL — ABNORMAL HIGH (ref 70–99)
Glucose-Capillary: 191 mg/dL — ABNORMAL HIGH (ref 70–99)
Glucose-Capillary: 294 mg/dL — ABNORMAL HIGH (ref 70–99)

## 2019-07-07 NOTE — Progress Notes (Signed)
I have reviewed the results of the angiogram with the patient. She remains neurologically intact. Will plan on discharge tomorrow with outpatient f/u in 3 weeks.

## 2019-07-08 LAB — GLUCOSE, CAPILLARY
Glucose-Capillary: 111 mg/dL — ABNORMAL HIGH (ref 70–99)
Glucose-Capillary: 159 mg/dL — ABNORMAL HIGH (ref 70–99)
Glucose-Capillary: 200 mg/dL — ABNORMAL HIGH (ref 70–99)

## 2019-07-08 MED ORDER — CYCLOBENZAPRINE HCL 10 MG PO TABS: 10.0000 mg | ORAL_TABLET | Freq: Two times a day (BID) | ORAL | 0 refills | Status: AC | PRN

## 2019-07-08 NOTE — Discharge Summary (Signed)
  Physician Discharge Summary  Patient ID: Carly Hicks MRN: [id number] DOB/AGE: 1971-07-27 48 y.o.  Admit date: 06/28/2019 Discharge date: 07/08/2019  Admission Diagnoses:  Subarachnoid Hemorrhage  Discharge Diagnoses:  Same Active Problems:   SAH (subarachnoid hemorrhage) ([place])   Poorly controlled type 2 diabetes mellitus [facility])   Discharged Condition: Stable  Hospital Course:  Carly Hicks is a 48 y.o. female initially presenting with sudden onset of severe headache.  CT scan demonstrated diffuse basal subarachnoid hemorrhage.  Initial angiogram was negative for intracranial aneurysm or arteriovenous malformation.  She remained neurologically stable under observation in the intensive care unit for 1 week without any neurologic change.  Repeat angiogram was also negative for intracranial pathology.  She remained neurologically stable, ambulating well, headache controlled with over-the-counter medication, and voiding normally.  She was therefore discharged in stable condition.  Her blood sugars were noted to be elevated, however she reports previous control of her blood sugar with diet and exercise.  Discharge Exam: Blood pressure 116/76, pulse (!) 111, temperature 98.8 F (37.1 C), temperature source Oral, resp. rate 16, height 5' 3.5" (1.613 m), weight 75.3 kg, last menstrual period 06/24/2019, SpO2 100 %. Awake, alert, oriented Speech fluent, appropriate CN grossly intact 5/5 BUE/BLE  Disposition: Discharge disposition: 01-Home or Self Care       Discharge Instructions    Diet - low sodium heart healthy   Complete by: As directed    Diet Carb Modified   Complete by: As directed    Discharge instructions   Complete by: As directed    Walk at home as much as possible, at least 4 times / day   Increase activity slowly   Complete by: As directed    Lifting restrictions   Complete by: As directed    No lifting > 10 lbs   May shower / Bathe   Complete by: As  directed    48 hours after surgery   May walk up steps   Complete by: As directed    No dressing needed   Complete by: As directed    Other Restrictions   Complete by: As directed    No bending/twisting at waist     Allergies as of 07/08/2019      Reactions   Beta Vulgaris Swelling   Beets      Medication List    TAKE these medications   calcium-vitamin D 500-200 MG-UNIT tablet Commonly known as: OSCAL WITH D Take 1 tablet by mouth.   cyclobenzaprine 10 MG tablet Commonly known as: FLEXERIL Take 1 tablet (10 mg total) by mouth 2 (two) times daily as needed for muscle spasms.   ibuprofen 200 MG tablet Commonly known as: ADVIL Take 200 mg by mouth every 6 (six) hours as needed for headache.   multivitamin with minerals Tabs tablet Take 1 tablet by mouth daily.   valACYclovir 500 MG tablet Commonly known as: VALTREX Take 1 tablet (500 mg total) by mouth 2 (two) times daily. TAKE ONE PO TWO TIMES A DAY X 3 DAYS AS NEEDED FOR OUTBREAK. What changed:   when to take this  additional instructions      Follow-up Information    Consuella Lose, MD Follow up in 3 week(s).   Specialty: Neurosurgery Contact information: [address] [place] [address] [phone]           Signed: Jairo Ben 07/08/2019, 8:52 AM

## 2019-07-09 ENCOUNTER — Encounter (HOSPITAL_COMMUNITY): Payer: Self-pay

## 2019-07-09 ENCOUNTER — Other Ambulatory Visit (HOSPITAL_COMMUNITY): Payer: Self-pay | Admitting: Neurosurgery

## 2019-07-09 DIAGNOSIS — I609 Nontraumatic subarachnoid hemorrhage, unspecified: Secondary | ICD-10-CM

## 2019-07-11 ENCOUNTER — Other Ambulatory Visit: Payer: Self-pay | Admitting: *Deleted

## 2019-07-11 NOTE — Patient Outreach (Signed)
[facility]) Care Management  07/11/2019  Carly Hicks Aug 07, 1971 [id number]   Subjective: Telephone call to patient's home  / mobile number, no answer, left HIPAA compliant voicemail message, and requested call back.    Objective: Per KPN (Knowledge Performance Now, point of care tool) and chart review, patient hospitalized 06/28/2019 - 07/08/2019 for [facility].  Patient also has a history of diabetes.    Assessment: Received [employer] EMMI Stroke Red Flag Alert follow up referral on 07/11/2019.  Red Flag Alert Trigger, Day #1, patient answered no to the following question: Filled new prescriptions?  [facility] EMMI follow up pending patient contact.     Plan: RNCM will send unsuccessful outreach letter, [facility] pamphlet, handout: Know Before You Go, will call patient for 2nd telephone outreach attempt within 4 business days, [facility] EMMI follow up, and proceed with case closure, within 10 business days if no return call.     Carly Hicks, BSN, [place] Management [facility] Telephonic CM Phone: [phone] Fax: [phone]

## 2019-07-12 ENCOUNTER — Ambulatory Visit: Payer: Self-pay | Admitting: *Deleted

## 2019-07-13 ENCOUNTER — Other Ambulatory Visit: Payer: Self-pay | Admitting: *Deleted

## 2019-07-13 NOTE — Patient Outreach (Signed)
[place] [facility]) Care Management  07/13/2019  Tamantha Saline 01/18/1971 [id number]   Subjective: Received voicemail message from Zadie Rhine, states she is returning call , and requested call back.  Telephone call to patient's home  / mobile number, no answer, left HIPAA compliant voicemail message, and requested call back.   Objective: Per KPN (Knowledge Performance Now, point of care tool) and chart review, patient hospitalized 06/28/2019 - 07/08/2019 for [facility].  Patient also has a history of diabetes.    Assessment: Received [employer] EMMI Stroke Red Flag Alert follow up referral on 07/11/2019.  Red Flag Alert Trigger, Day #1, patient answered no to the following question: Filled new prescriptions?  [facility] EMMI follow up pending patient contact.     Plan: RNCM has sent unsuccessful outreach letter, [facility] pamphlet, handout: Know Before You Go, will call patient for 3rd telephone outreach attempt within 4 business days, [facility] EMMI follow up, and proceed with case closure, within 10 business days if no return call.    Krystyl Cannell H. Annia Friendly, BSN, [place] Management [facility] Telephonic CM Phone: [phone] Fax: [phone]

## 2019-07-16 ENCOUNTER — Other Ambulatory Visit: Payer: Self-pay | Admitting: *Deleted

## 2019-07-16 NOTE — Patient Outreach (Signed)
[facility]) Care Management  07/16/2019  Kamden Reber [birth year] [id number]    RED ON EMMI ALERT Day # 6 Date: 07/15/2019 Red Alert Reason: smoking/around smoke   Outreach attempt #3, successful.  Noted that assigned RNCM has made 2 unsuccessful attempted to reach member due to [facility] Stroke alerts, received new alert today for above reason.  Call placed to member, identity verified.  This care manager introduced self and stated purpose of call.  She report she is doing well, denies any questions of concerns regarding condition.  State she does not smoke nor is around smoke at any time, unsure of the report stating she may have been asleep during call and recording accepted answer in error.  State she still has not scheduled follow up appointment yet with PCP, have not seen seen neurologist but state she will schedule appointment with both.  Does not have PCP listed in Epic, report she was not satisfied with her previous one and will select new one.  Offered to have list of providers sent to member and/or review over the phone, she declines.  State she will take care of it.    Denies any urgent concerns at this time, advised to contact this care manager with questions, provided with contact information.    Plan: RN CM will provide update to assigned care manager to follow up on appointments.  Valente David, [place], MSN [place] [phone]

## 2019-07-17 ENCOUNTER — Other Ambulatory Visit: Payer: Self-pay | Admitting: *Deleted

## 2019-07-17 NOTE — Patient Outreach (Signed)
[place] [facility]) Care Management  07/17/2019  Carly Hicks Sep 22, 1970 [id number]   Subjective: Received voicemail message from Carly Hicks, states she is returning call , and requested call back.  Telephone call to patient's home  / mobile number, no answer, left HIPAA compliant voicemail message, and requested call back.   Objective:Per KPN (Knowledge Performance Now, point of care tool) and chart review,patient hospitalized 06/28/2019 - 07/08/2019 for [facility]. Patient also has a history of diabetes.    Assessment: Received [employer] EMMI Stroke Red Flag Alert follow up referral on 07/11/2019. Red Flag Alert Trigger, Day #1, patient answered no to the following question: Filled new prescriptions? Per chart review Day # 6 EMMI Red Flag Alert  (Smoked or been around smoke?), was address by covering RNCM [facility]) on 07/16/2019. [facility] EMMI follow up for Day #1 Red Alert, pending patient contact.   Plan:RNCM has sent unsuccessful outreach letter, [facility] pamphlet, handout: Know Before You Go, will call patient for 1 additional telephone outreach attempt within 4 business days, [facility] EMMI follow up on Day #1 Red Flag Alert, and case will remain closed.     Carly Hicks H. Annia Friendly, BSN, [place] Management [facility] Telephonic CM Phone: [phone] Fax: [phone]

## 2019-07-19 ENCOUNTER — Other Ambulatory Visit: Payer: Self-pay | Admitting: *Deleted

## 2019-07-19 NOTE — Patient Outreach (Addendum)
[place] [facility]) Care Management  07/19/2019  Carly Hicks Jun 16, 1971 [id number]   Subjective: Telephone call to patient's home / mobile number, no answer, left HIPAA compliant voicemail message, and requested call back.   Objective:Per KPN (Knowledge Performance Now, point of care tool) and chart review,patient hospitalized 06/28/2019 - 07/08/2019 for [facility]. Patient also has a history of diabetes.    Assessment: Received [employer] EMMI Stroke Red Flag Alert follow up referral on 07/11/2019. Red Flag Alert Trigger, Day #1, patient answered no to the following question: Filled new prescriptions? Per chart review Day # 6 EMMI Red Flag Alert  (Smoked or been around smoke?), was address by covering RNCM [facility]) on 07/16/2019. [facility] EMMI follow up for Day #1 Red Alert, pending patient contact.    Plan:RNCMhas sentunsuccessful outreach letter, [facility] pamphlet, handout: Know Before You Go, and case will remain closed.  RNCM will complete Day #1 EMMI Red Flag  Alert follow up if patient return call.     Janneth Krasner H. Annia Friendly, BSN, [place] Management [facility] Telephonic CM Phone: [phone] Fax: [phone]

## 2019-07-23 ENCOUNTER — Other Ambulatory Visit: Payer: Self-pay | Admitting: *Deleted

## 2019-07-23 NOTE — Patient Outreach (Addendum)
[facility]) Care Management  07/23/2019  Carly Hicks March 01, 1971 [id number]   Subjective: Telephone call to patient's home  / mobile number, no answer, left HIPAA compliant voicemail message, and requested call back.   Objective:Per KPN (Knowledge Performance Now, point of care tool) and chart review,patient hospitalized 06/28/2019 - 07/08/2019 for [facility]. Patient also has a history of diabetes.    Assessment: Received [employer] EMMI Stroke Red Flag Alert follow up referral on 07/11/2019. Red Flag Alert Trigger, Day #1, patient answered no to the following question: Filled new prescriptions? Per chart review Day # 6 EMMI Red Flag Alert (Smoked or been around smoke?), was address by covering RNCM [facility]) on 07/16/2019. Received new EMMI Stroke Red Flag Alert follow up referral on 07/23/2019.  Red Flag Alert Trigger, Day #13, patient answered no to the following question: Went to follow-up appointment?   [facility] EMMI follow upfor Day #1 and Day #13 Red Alert,pending patient contact.    Plan:RNCMhas sentunsuccessful outreach letter, [facility] pamphlet, handout: Know Before You Go. RNCM will close case on 07/24/2019,  if no return call, which is 10 business days from initial attempted patient outreach.  RNCM will complete Day #1 and Day #13 EMMI Red Flag  Alert follow up if patient return call.      Peggy Monk H. Annia Friendly, BSN, [place] Management [facility] Telephonic CM Phone: [phone] Fax: [phone]

## 2019-07-24 ENCOUNTER — Other Ambulatory Visit: Payer: Self-pay | Admitting: *Deleted

## 2019-07-24 NOTE — Patient Outreach (Signed)
[facility]) Care Management  07/24/2019  Carly Hicks 1971-07-30 [id number]   No response from patient outreach attempts will proceed with case closure.    Objective:Per KPN (Knowledge Performance Now, point of care tool) and chart review,patient hospitalized 06/28/2019 - 07/08/2019 for [facility]. Patient also has a history of diabetes.    Assessment: Received [employer] EMMI Stroke Red Flag Alert follow up referral on 07/11/2019. Red Flag Alert Trigger, Day #1, patient answered no to the following question: Filled new prescriptions? Per chart review Day # 6 EMMI Red Flag Alert (Smoked or been around smoke?), was address by covering RNCM [facility]) on 07/16/2019. Received new EMMI Stroke Red Flag Alert follow up referral on 07/23/2019.  Red Flag Alert Trigger, Day #13, patient answered no to the following question: Went to follow-up appointment?   [facility] EMMI follow upfor Day #1 and Day #13 Red Alert,not completed due to patient unable to contact.     Plan:Case closure due to unable to reach.     Ethelda Deangelo H. Annia Friendly, BSN, [place] [facility] Telephonic CM Phone: [phone] Fax: [phone]

## 2020-01-15 ENCOUNTER — Other Ambulatory Visit: Payer: Self-pay | Admitting: Physician Assistant

## 2020-01-17 ENCOUNTER — Other Ambulatory Visit: Payer: Self-pay | Admitting: Physician Assistant

## 2020-01-17 DIAGNOSIS — I609 Nontraumatic subarachnoid hemorrhage, unspecified: Secondary | ICD-10-CM

## 2020-02-20 ENCOUNTER — Ambulatory Visit
Admission: RE | Admit: 2020-02-20 | Discharge: 2020-02-20 | Disposition: A | Discharge status: Home / Self Care | Payer: Managed Care, Other (non HMO) | Source: Ambulatory Visit | Attending: Physician Assistant | Admitting: Physician Assistant

## 2020-02-20 ENCOUNTER — Other Ambulatory Visit: Payer: Self-pay

## 2020-02-20 DIAGNOSIS — I609 Nontraumatic subarachnoid hemorrhage, unspecified: Secondary | ICD-10-CM

## 2020-02-20 MED ORDER — GADOBENATE DIMEGLUMINE 529 MG/ML IV SOLN
15.0000 mL | Freq: Once | INTRAVENOUS | Status: AC | PRN
  Administered 2020-02-20: 15 mL via INTRAVENOUS

## 2021-03-08 IMAGING — MR MR HEAD WO/W CM
13 series · 48 of 48 positions shown · non-contrast
Comparison: None.

CLINICAL DATA: Prior subarachnoid hemorrhage.

EXAM:
MRI HEAD WITHOUT CONTRAST
MRA HEAD WITHOUT CONTRAST
TECHNIQUE: Multiplanar, multiecho pulse sequences of the brain and surrounding
structures were obtained without intravenous contrast. Angiographic
images of the head were obtained using MRA technique without
contrast.

[Series 5: T1 · sagittal · 4.0mm · 0.75mm/px · 1 of 31 slices shown (1 of 3)]
[im 1/31]
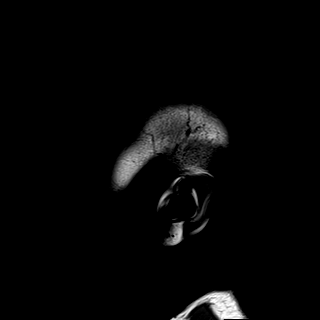

[Series 6: FLAIR · axial · 3.0mm · 0.72mm/px · z∈[-51,+99]mm · 2 of 26 slices shown]
[im 1/26]
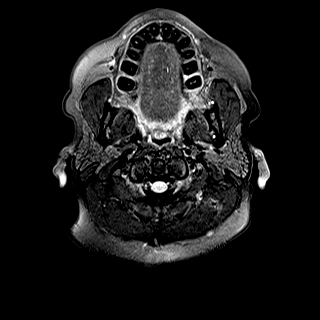
[im 26/26]
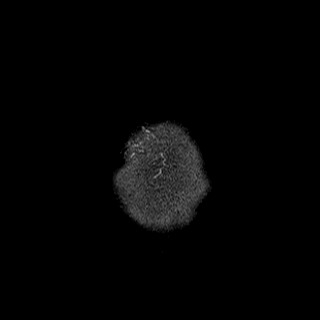

[Series 7: DWI · axial · 3.0mm · 1.44mm/px · z∈[-42,+94]mm · 5 of 84 slices shown (1 of 4)]
[im 1/84]
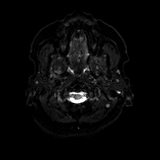
[im 21/84]
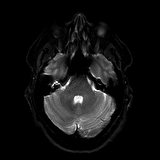
[im 42/84]
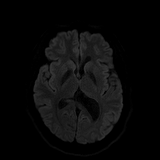
[im 63/84]
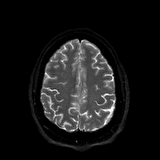
[im 84/84]
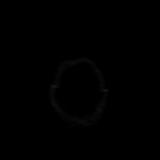

[Series 8: DWI · axial · 3.0mm · 1.44mm/px · z∈[-42,+94]mm · 2 of 42 slices shown (2 of 4)]
[im 1/42]
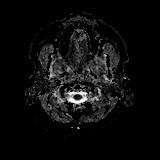
[im 42/42]
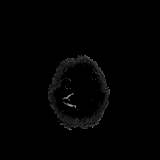

[Series 9: DWI · coronal · 5.0mm · 1.44mm/px · 4 of 60 slices shown (3 of 4)]
[im 1/60]
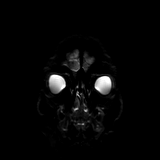
[im 20/60]
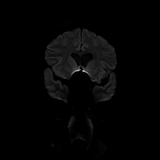
[im 40/60]
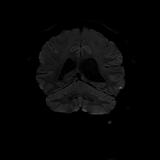
[im 60/60]
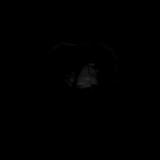

[Series 10: DWI · coronal · 5.0mm · 1.44mm/px · 2 of 30 slices shown (4 of 4)]
[im 1/30]
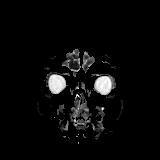
[im 30/30]
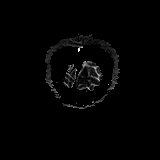

[Series 11: T2 · axial · 4.0mm · 0.36mm/px · z∈[-43,+92]mm · 2 of 27 slices shown]
[im 1/27]
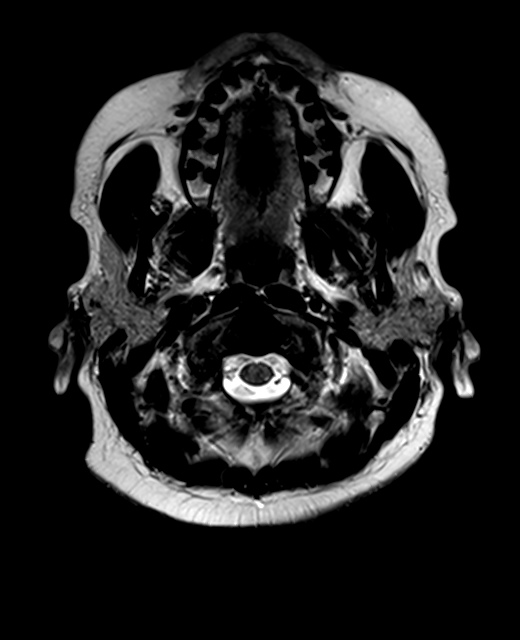
[im 27/27]
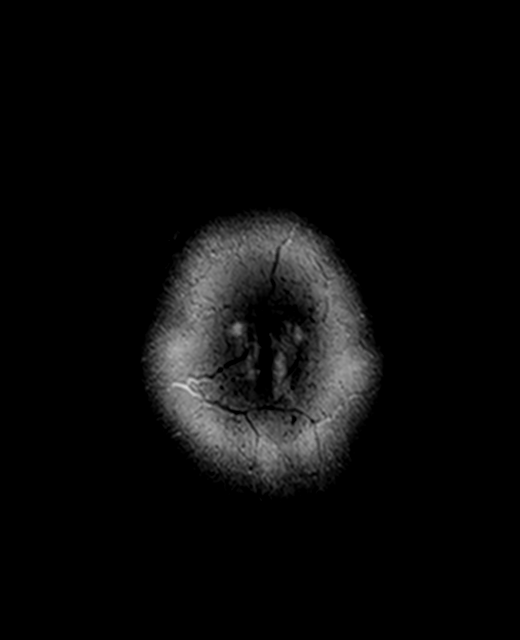

[Series 13: swi_images · axial · 1.5mm · 0.90mm/px · z∈[-47,+96]mm · 6 of 96 slices shown]
[im 1/96]
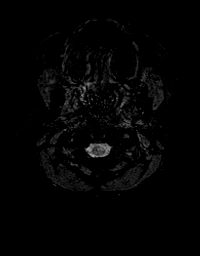
[im 20/96]
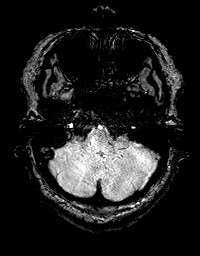
[im 39/96]
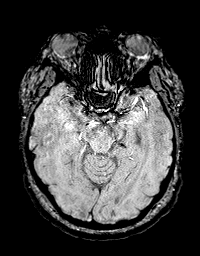
[im 58/96]
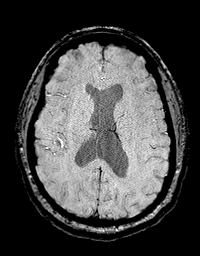
[im 77/96]
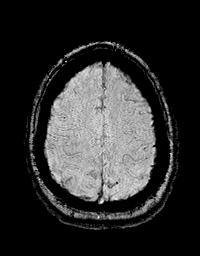
[im 96/96]
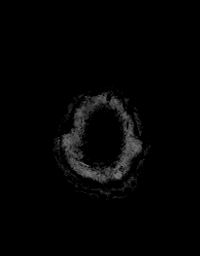

[Series 14: T1 · axial · 1.0mm · 0.94mm/px · z∈[-55,+103]mm · 9 of 160 slices shown (2 of 3)]
[im 1/160]
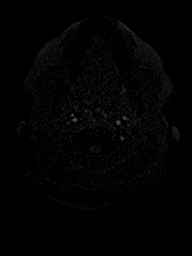
[im 20/160]
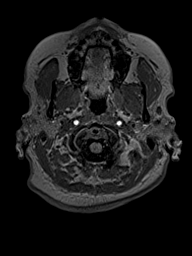
[im 40/160]
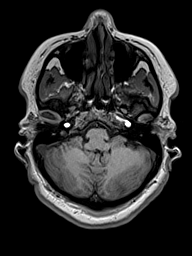
[im 60/160]
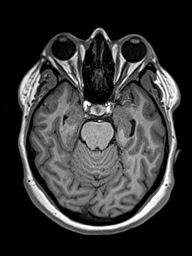
[im 80/160]
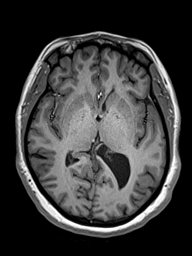
[im 100/160]
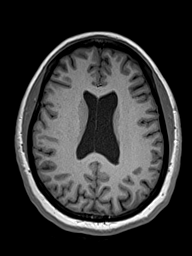
[im 120/160]
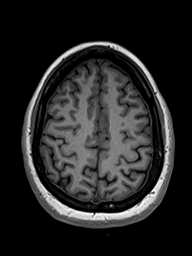
[im 140/160]
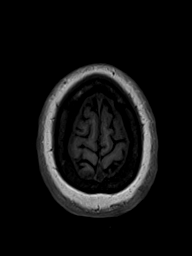
[im 160/160]
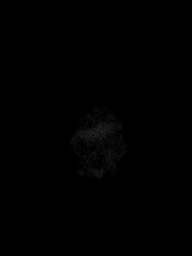

[Series 15: T2 post-contrast · coronal · 4.0mm · 0.36mm/px · 2 of 33 slices shown]
[im 1/33]
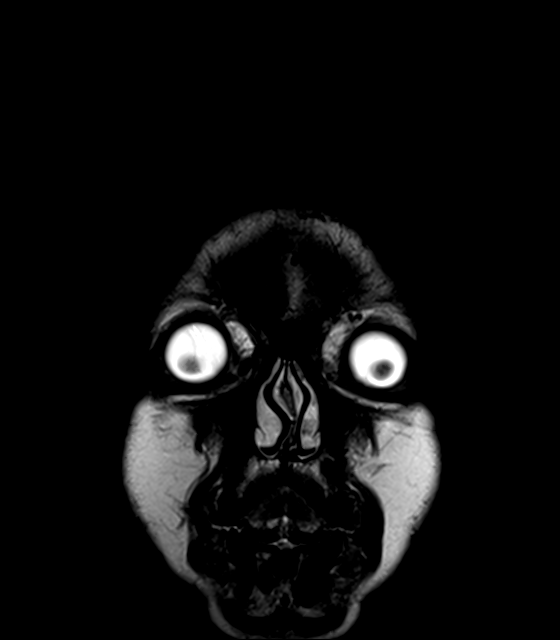
[im 33/33]
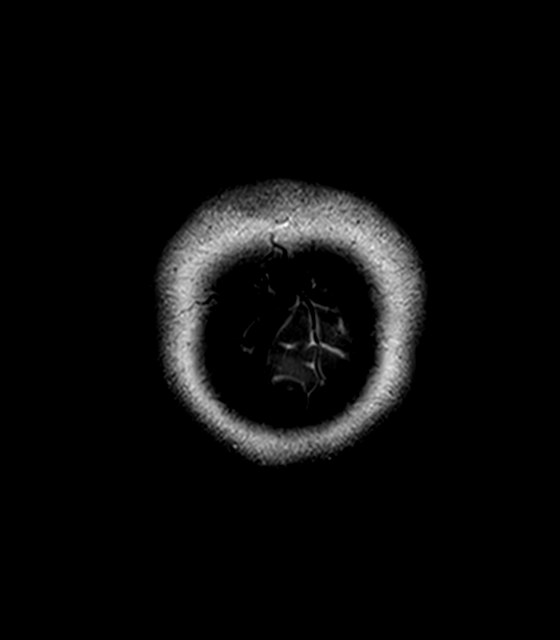

[Series 16: T1 · axial · 1.0mm · 0.94mm/px · z∈[-55,+103]mm · 9 of 160 slices shown (3 of 3)]
[im 1/160]
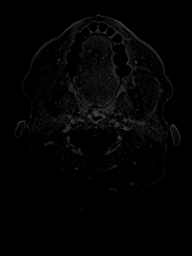
[im 20/160]
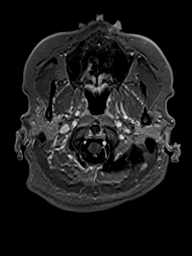
[im 40/160]
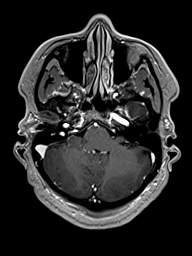
[im 60/160]
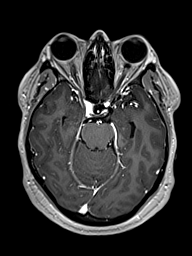
[im 80/160]
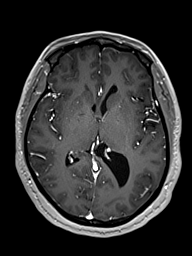
[im 100/160]
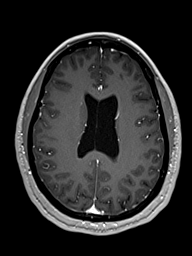
[im 120/160]
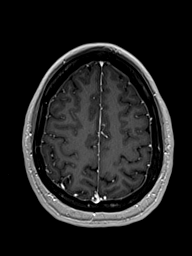
[im 140/160]
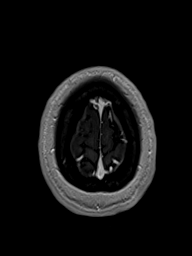
[im 160/160]
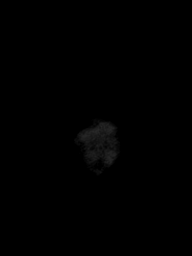

[Series 17: T1 post-contrast · coronal · 4.0mm · 0.72mm/px · 2 of 33 slices shown (1 of 2)]
[im 1/33]
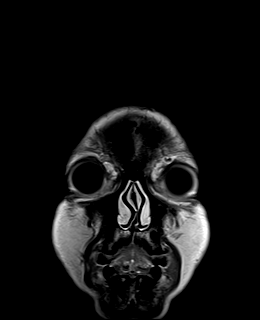
[im 33/33]
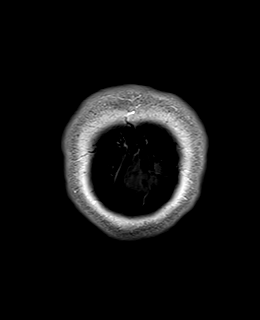

[Series 18: T1 post-contrast · sagittal · 4.0mm · 0.75mm/px · 2 of 31 slices shown (2 of 2)]
[im 1/31]
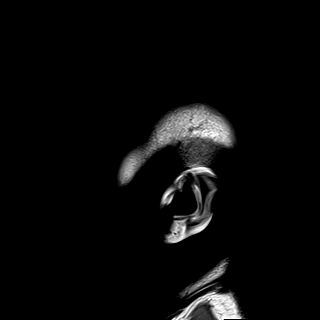
[im 31/31]
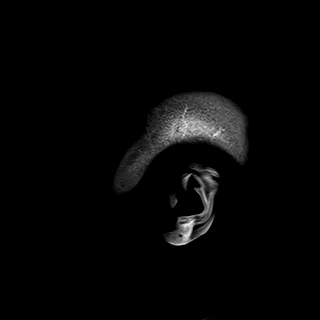

[48 of 48 positions shown; findings below may reference images not displayed]

FINDINGS: MRI HEAD FINDINGS

BRAIN: No acute infarct, acute hemorrhage or extra-axial collection.
Normal white matter signal. Normal volume of CSF spaces. There is
hemosiderin deposition in the basal cisterns at the site of prior
subarachnoid hemorrhage. Normal midline structures.

VASCULAR: Major flow voids are preserved.

SKULL AND UPPER CERVICAL SPINE: Normal calvarium and skull base.
Visualized upper cervical spine and soft tissues are normal.

SINUSES/ORBITS: No paranasal sinus fluid levels or advanced mucosal
thickening. No mastoid or middle ear effusion. Normal orbits.

MRA HEAD FINDINGS

POSTERIOR CIRCULATION:

--Vertebral arteries: Normal V4 segments.

--Inferior cerebellar arteries: Normal.

--Basilar artery: Normal.

--Superior cerebellar arteries: Normal.

--Posterior cerebral arteries: Normal. There are bilateral posterior
communicating arteries (p-comm) that partially supply the PCAs.

ANTERIOR CIRCULATION:

--Intracranial internal carotid arteries: Normal.

--Anterior cerebral arteries (ACA): Normal. No anterior
communicating artery is visualized.

--Middle cerebral arteries (MCA): Normal.

There is no aneurysm, arteriovenous malformation or fistula.
IMPRESSION: 1. Hemosiderin deposition in the basal cisterns at the site of prior
subarachnoid hemorrhage.
2. Normal MRI/MRA of the brain.

## 2024-02-24 ENCOUNTER — Other Ambulatory Visit: Payer: Self-pay | Admitting: Obstetrics and Gynecology

## 2024-02-24 DIAGNOSIS — Z1231 Encounter for screening mammogram for malignant neoplasm of breast: Secondary | ICD-10-CM

## 2024-04-26 ENCOUNTER — Ambulatory Visit

## 2024-05-11 ENCOUNTER — Ambulatory Visit
Admission: RE | Admit: 2024-05-11 | Discharge: 2024-05-11 | Disposition: A | Discharge status: Home / Self Care | Source: Ambulatory Visit | Attending: Obstetrics and Gynecology | Admitting: Obstetrics and Gynecology

## 2024-05-11 DIAGNOSIS — Z1231 Encounter for screening mammogram for malignant neoplasm of breast: Secondary | ICD-10-CM
